# Patient Record
Sex: Male | Born: 1970 | Race: White | Hispanic: Yes | Marital: Married | Smoking: Never smoker
Health system: Southern US, Community
[De-identification: ages and names within clinical notes are randomized; demographics above are authoritative.]

## PROBLEM LIST (undated history)

## (undated) DIAGNOSIS — K2970 Gastritis, unspecified, without bleeding: Secondary | ICD-10-CM

## (undated) DIAGNOSIS — E785 Hyperlipidemia, unspecified: Secondary | ICD-10-CM

## (undated) DIAGNOSIS — K219 Gastro-esophageal reflux disease without esophagitis: Secondary | ICD-10-CM

## (undated) HISTORY — DX: Gastro-esophageal reflux disease without esophagitis: K21.9

## (undated) HISTORY — DX: Gastritis, unspecified, without bleeding: K29.70

## (undated) HISTORY — DX: Hyperlipidemia, unspecified: E78.5

---

## 2008-08-23 ENCOUNTER — Emergency Department (HOSPITAL_COMMUNITY): Admission: EM | Admit: 2008-08-23 | Discharge: 2008-08-23 | Payer: Self-pay | Admitting: Emergency Medicine

## 2009-01-18 ENCOUNTER — Ambulatory Visit: Payer: Self-pay | Admitting: Internal Medicine

## 2017-01-04 ENCOUNTER — Ambulatory Visit (INDEPENDENT_AMBULATORY_CARE_PROVIDER_SITE_OTHER): Payer: 59 | Admitting: Physician Assistant

## 2017-01-04 VITALS — BP 126/84 | HR 80 | Temp 97.6°F | Resp 16 | Ht 67.0 in | Wt 198.0 lb

## 2017-01-04 DIAGNOSIS — Z23 Encounter for immunization: Secondary | ICD-10-CM

## 2017-01-04 DIAGNOSIS — Z13228 Encounter for screening for other metabolic disorders: Secondary | ICD-10-CM | POA: Diagnosis not present

## 2017-01-04 DIAGNOSIS — L709 Acne, unspecified: Secondary | ICD-10-CM | POA: Diagnosis not present

## 2017-01-04 DIAGNOSIS — Z0000 Encounter for general adult medical examination without abnormal findings: Secondary | ICD-10-CM

## 2017-01-04 DIAGNOSIS — Z1329 Encounter for screening for other suspected endocrine disorder: Secondary | ICD-10-CM | POA: Diagnosis not present

## 2017-01-04 DIAGNOSIS — Z125 Encounter for screening for malignant neoplasm of prostate: Secondary | ICD-10-CM | POA: Diagnosis not present

## 2017-01-04 DIAGNOSIS — Z13220 Encounter for screening for lipoid disorders: Secondary | ICD-10-CM

## 2017-01-04 DIAGNOSIS — Z130 Encounter for screening for diseases of the blood and blood-forming organs and certain disorders involving the immune mechanism: Secondary | ICD-10-CM | POA: Diagnosis not present

## 2017-01-04 DIAGNOSIS — Z113 Encounter for screening for infections with a predominantly sexual mode of transmission: Secondary | ICD-10-CM

## 2017-01-04 MED ORDER — CLINDAMYCIN PHOSPHATE 1 % EX FOAM: 1.0000 "application " | Freq: Every day | CUTANEOUS | 0 refills | Status: DC

## 2017-01-04 NOTE — Progress Notes (Signed)
[facility] [address], [place] [address] [phone]  Date:  01/04/2017   Name:  Nathaniel Davidson   DOB:  [birth year]   MRN:  [id number]  PCP:  No primary care provider on file.    History of Present Illness:  Nathaniel Davidson is a 46 y.o. male patient who presents to [facility] for annuall physical exam. --1.5 years since his cholesterol was checked.   -- --DIET: chicken, fish, rice.  Vegetables daily.  Water intake: 2 L per day.  Caffeine intake: once per day.  Soda 1-2 per week.  Sweet tea little during mealtime.  --BM: normal.  No black or blood in stool.  No diarrhea or constipation  --URINATION: normal.  No dysuria, hematuria, or frequency.  --SLEEP: works, bed to early hard to sleep.  Later is easier  7 hours per night  --SOCIAL ACTIVITY: no hobbies, going to restaurants.  Friends in the area.   Industrial cleaning EtOH: 1-3 beers Tobacco or vaping: no Illicit drug use: No No difficulty with sexual activity or pain  Small concern of rash along the back of his head with bumps that rise and go away.  Wife will pull hairs out of the bumps and pus will come out.  He wears a helmet where there is constant rubbing.     There are no active problems to display for this patient.   History reviewed. No pertinent past medical history.  History reviewed. No pertinent surgical history.  Social History  Substance Use Topics  . Smoking status: Never Smoker  . Smokeless tobacco: Never Used  . Alcohol use 0.6 - 1.8 oz/week    1 - 3 Cans of beer per week    Family History  Problem Relation Age of Onset  . Cancer Father 19    prostate    No Known Allergies  Medication list has been reviewed and updated.  No current outpatient prescriptions on file prior to visit.   No current facility-administered medications on file prior to visit.     Review of Systems  Constitutional: Negative for chills and fever.  HENT: Negative for ear  discharge, ear pain and sore throat.   Eyes: Negative for blurred vision and double vision.  Respiratory: Negative for cough, shortness of breath and wheezing.   Cardiovascular: Negative for chest pain, palpitations and leg swelling.  Gastrointestinal: Negative for diarrhea, nausea and vomiting.  Genitourinary: Negative for dysuria, frequency and hematuria.  Skin: Negative for itching and rash.  Neurological: Negative for dizziness and headaches.     Physical Examination: BP 126/84 (BP Location: Right Arm, Patient Position: Sitting, Cuff Size: Small)   Pulse 80   Temp 97.6 F (36.4 C) (Oral)   Resp 16   Ht 5' 7"  (1.702 m)   Wt 198 lb (89.8 kg)   SpO2 100%   BMI 31.01 kg/m  Ideal Body Weight: Weight in (lb) to have BMI = 25: 159.3  Physical Exam  Constitutional: He is oriented to person, place, and time. He appears well-developed and well-nourished. No distress.  HENT:  Head: Normocephalic and atraumatic.  Right Ear: Tympanic membrane, external ear and ear canal normal.  Left Ear: Tympanic membrane, external ear and ear canal normal.  Nose: No mucosal edema or rhinorrhea. Right sinus exhibits no maxillary sinus tenderness and no frontal sinus tenderness. Left sinus exhibits no maxillary sinus tenderness and no frontal sinus tenderness.  Mouth/Throat: No uvula swelling. No oropharyngeal exudate, posterior oropharyngeal edema or posterior  oropharyngeal erythema.  Scant papules and pustules along the the occipital in a linear positioning.    Eyes: Conjunctivae, EOM and lids are normal. Pupils are equal, round, and reactive to light. Right eye exhibits normal extraocular motion. Left eye exhibits normal extraocular motion.  Neck: Trachea normal and full passive range of motion without pain. No edema and no erythema present.  Cardiovascular: Normal rate and regular rhythm.  Exam reveals no friction rub.   No murmur heard. Pulmonary/Chest: Effort normal. No respiratory distress. He has  no decreased breath sounds. He has no wheezes. He has no rhonchi.  Abdominal: Soft. Bowel sounds are normal. He exhibits no distension and no mass. There is no tenderness.  Musculoskeletal: Normal range of motion. He exhibits no edema or tenderness.  Neurological: He is alert and oriented to person, place, and time. He displays normal reflexes.  Skin: Skin is warm and dry. He is not diaphoretic.  Psychiatric: He has a normal mood and affect. His behavior is normal.     Assessment and Plan: Nathaniel Davidson is a 46 y.o. male who is here today for annual physical exam. Will restart simvastatin as needed pending lipid panel. Declines prostate exam at this time. Advised to disinfect work hat.  Foam daily for rash following shampooing hair..  Annual physical exam - Plan: Lipid panel, TSH, CMP14+EGFR, CBC, PSA, HIV antibody, RPR  Need for prophylactic vaccination and inoculation against influenza - Plan: Flu Vaccine QUAD 36+ mos PF IM (Fluarix & Fluzone Quad PF)  Screening for thyroid disorder - Plan: TSH  Screening for lipid disorders - Plan: Lipid panel  Screening for deficiency anemia - Plan: CBC  Screening for prostate cancer - Plan: PSA  Screening for STD (sexually transmitted disease) - Plan: HIV antibody, RPR  Screening for metabolic disorder - Plan: CMP14+EGFR  Acne, unspecified acne type - Plan: Clindamycin Phosphate foam  Ivar Drape, PA-C [facility] 3/11/20186:25 PM

## 2017-01-04 NOTE — Patient Instructions (Signed)
Recommend increase your exercise to 4 times per day for 30 minutes Make sure you are having water intake greater than 64 oz Keeping you healthy  Get these tests  Blood pressure- Have your blood pressure checked once a year by your healthcare provider.  Normal blood pressure is 120/80.  Weight- Have your body mass index (BMI) calculated to screen for obesity.  BMI is a measure of body fat based on height and weight. You can also calculate your own BMI at https://www.west-esparza.com/www.[email or web address]/bmi/.  Cholesterol- Have your cholesterol checked regularly starting at age 48, sooner may be necessary if you have diabetes, high blood pressure, if a family member developed heart diseases at an early age or if you smoke.   Chlamydia, HIV, and other sexual transmitted disease- Get screened each year until the age of 48 then within three months of each new sexual partner.  Diabetes- Have your blood sugar checked regularly if you have high blood pressure, high cholesterol, a family history of diabetes or if you are overweight.  Get these vaccines  Flu shot- Every fall.  Tetanus shot- Every 10 years.  Menactra- Single dose; prevents meningitis.  Take these steps  Don't smoke- If you do smoke, ask your healthcare provider about quitting. For tips on how to quit, go to www.smokefree.gov or call 1-800-QUIT-NOW.  Be physically active- Exercise 5 days a week for at least 30 minutes.  If you are not already physically active start slow and gradually work up to 30 minutes of moderate physical activity.  Examples of moderate activity include walking briskly, mowing the yard, dancing, swimming bicycling, etc.  Eat a healthy diet- Eat a variety of healthy foods such as fruits, vegetables, low fat milk, low fat cheese, yogurt, lean meats, poultry, fish, beans, tofu, etc.  For more information on healthy eating, go to www.thenutritionsource.org  Drink alcohol in moderation- Limit alcohol intake two drinks or less a day.  Never  drink and drive.  Dentist- Brush and floss teeth twice daily; visit your dentis twice a year.  Depression-Your emotional health is as important as your physical health.  If you're feeling down, losing interest in things you normally enjoy please talk with your healthcare provider.  Gun Safety- If you keep a gun in your home, keep it unloaded and with the safety lock on.  Bullets should be stored separately.  Helmet use- Always wear a helmet when riding a motorcycle, bicycle, rollerblading or skateboarding.  Safe sex- If you may be exposed to a sexually transmitted infection, use a condom  Seat belts- Seat bels can save your life; always wear one.  Smoke/Carbon Monoxide detectors- These detectors need to be installed on the appropriate level of your home.  Replace batteries at least once a year.  Skin Cancer- When out in the sun, cover up and use sunscreen SPF 15 or higher.  Violence- If anyone is threatening or hurting you, please tell your healthcare provider.

## 2017-01-06 LAB — CBC
Hematocrit: 50.4 % (ref 37.5–51.0)
Hemoglobin: 17.4 g/dL (ref 13.0–17.7)
MCH: 28.5 pg (ref 26.6–33.0)
MCHC: 34.5 g/dL (ref 31.5–35.7)
MCV: 83 fL (ref 79–97)
Platelets: 224 10*3/uL (ref 150–379)
RBC: 6.1 x10E6/uL — ABNORMAL HIGH (ref 4.14–5.80)
RDW: 14 % (ref 12.3–15.4)
WBC: 4.8 10*3/uL (ref 3.4–10.8)

## 2017-01-06 LAB — CMP14+EGFR
A/G RATIO: 1.6 (ref 1.2–2.2)
ALBUMIN: 4.3 g/dL (ref 3.5–5.5)
ALT: 23 IU/L (ref 0–44)
AST: 16 IU/L (ref 0–40)
Alkaline Phosphatase: 82 IU/L (ref 39–117)
BILIRUBIN TOTAL: 0.4 mg/dL (ref 0.0–1.2)
BUN / CREAT RATIO: 14 (ref 9–20)
BUN: 13 mg/dL (ref 6–24)
CHLORIDE: 101 mmol/L (ref 96–106)
CO2: 26 mmol/L (ref 18–29)
Calcium: 9 mg/dL (ref 8.7–10.2)
Creatinine, Ser: 0.95 mg/dL (ref 0.76–1.27)
GFR calc non Af Amer: 95 mL/min/{1.73_m2} (ref >59)
GFR, EST AFRICAN AMERICAN: 110 mL/min/{1.73_m2} (ref >59)
GLOBULIN, TOTAL: 2.7 g/dL (ref 1.5–4.5)
Glucose: 88 mg/dL (ref 65–99)
POTASSIUM: 4.8 mmol/L (ref 3.5–5.2)
Sodium: 139 mmol/L (ref 134–144)
TOTAL PROTEIN: 7 g/dL (ref 6.0–8.5)

## 2017-01-06 LAB — TSH: TSH: 1.19 u[IU]/mL (ref 0.450–4.500)

## 2017-01-06 LAB — HIV ANTIBODY (ROUTINE TESTING W REFLEX): HIV SCREEN 4TH GENERATION: NONREACTIVE

## 2017-01-06 LAB — LIPID PANEL
CHOLESTEROL TOTAL: 236 mg/dL — AB (ref 100–199)
Chol/HDL Ratio: 4.6 ratio units (ref 0.0–5.0)
HDL: 51 mg/dL (ref >39)
LDL Calculated: 149 mg/dL — ABNORMAL HIGH (ref 0–99)
Triglycerides: 180 mg/dL — ABNORMAL HIGH (ref 0–149)
VLDL CHOLESTEROL CAL: 36 mg/dL (ref 5–40)

## 2017-01-06 LAB — RPR: RPR Ser Ql: NONREACTIVE

## 2017-01-06 LAB — PSA: PROSTATE SPECIFIC AG, SERUM: 0.8 ng/mL (ref 0.0–4.0)

## 2017-01-20 ENCOUNTER — Telehealth: Payer: Self-pay

## 2017-01-20 NOTE — Telephone Encounter (Signed)
Pt stopped in for copy of labs, lipids abnormal, any advice?

## 2017-06-05 ENCOUNTER — Ambulatory Visit: Payer: 59 | Admitting: Emergency Medicine

## 2017-07-22 ENCOUNTER — Ambulatory Visit (INDEPENDENT_AMBULATORY_CARE_PROVIDER_SITE_OTHER): Payer: 59 | Admitting: Emergency Medicine

## 2017-07-22 ENCOUNTER — Encounter: Payer: Self-pay | Admitting: Emergency Medicine

## 2017-07-22 VITALS — BP 112/76 | HR 76 | Temp 97.6°F | Resp 16 | Ht 68.25 in | Wt 188.2 lb

## 2017-07-22 DIAGNOSIS — R1013 Epigastric pain: Secondary | ICD-10-CM | POA: Insufficient documentation

## 2017-07-22 DIAGNOSIS — L709 Acne, unspecified: Secondary | ICD-10-CM

## 2017-07-22 MED ORDER — CLINDAMYCIN PHOSPHATE 1 % EX FOAM: 1.0000 "application " | Freq: Every day | CUTANEOUS | 0 refills | Status: DC

## 2017-07-22 MED ORDER — RANITIDINE HCL 300 MG PO TABS: 300.0000 mg | ORAL_TABLET | Freq: Every day | ORAL | 1 refills | Status: DC

## 2017-07-22 MED ORDER — OMEPRAZOLE 20 MG PO CPDR: 20.0000 mg | DELAYED_RELEASE_CAPSULE | Freq: Two times a day (BID) | ORAL | 1 refills | Status: DC

## 2017-07-22 NOTE — Patient Instructions (Addendum)
IF you received an x-ray today, you will receive an invoice from [facility]. Please contact [facility] at [phone] with questions or concerns regarding your invoice.   IF you received labwork today, you will receive an invoice from [place]. Please contact [facility] at [phone] with questions or concerns regarding your invoice.   Our billing staff will not be able to assist you with questions regarding bills from these companies.  You will be contacted with the lab results as soon as they are available. The fastest way to get your results is to activate your My Chart account. Instructions are located on the last page of this paperwork. If you have not heard from [place] regarding the results in 2 weeks, please contact this office.     Gastritis en los adultos (Gastritis, Adult) La gastritis es la irritacin (inflamacin) del estmago. Si tiene [employer], puede presentar algunos de estos problemas (sntomas):  Dolor estomacal.  Sensacin de ardor en el estmago.  Ganas de vomitar (nuseas).  Vmitos.  Sensacin de estar demasiado lleno despus de comer. Es importante recibir ayuda para Copy. Sin ayuda, el [employer] y se pueden formar llagas (lceras). CUIDADOS EN EL HOGAR  Tome los medicamentos de venta libre y los recetados solamente como se lo haya indicado el mdico.  Si le recetaron un antibitico, tmelo como se lo haya indicado el mdico. No deje de tomarlo aunque comience a sentirse mejor.  Beba suficiente lquido para mantener el pis (orina) claro o de color amarillo plido.  En lugar de comer en gran cantidad, prepare pequeas porciones de comida. SOLICITE AYUDA SI:  Los problemas empeoran.  Los problemas desaparecen, pero luego vuelven a Research officer, trade union. SOLICITE AYUDA DE INMEDIATO SI:  Vomita sangre o una sustancia parecida a los granos de [place].  La materia fecal (heces) es negra o de color rojo oscuro.  No puede  retener los lquidos.  El dolor de [place].  Tiene fiebre.  No mejora luego de 1 semana. Esta informacin no tiene Theme park manager el consejo del mdico. Asegrese de hacerle al mdico cualquier pregunta que tenga. Document Released: 04/14/2012 Document Revised: 07/08/2015 Document Reviewed: 07/08/2015 Elsevier Interactive Patient Education  2018 Elsevier Inc.  Gastritis, Adult Gastritis is swelling (inflammation) of the stomach. When you have this condition, you can have these problems (symptoms):  Pain in your stomach.  A burning feeling in your stomach.  Feeling sick to your stomach (nauseous).  Throwing up (vomiting).  Feeling too full after you eat.  It is important to get help for this condition. Without help, your stomach can bleed, and you can get sores (ulcers) in your stomach. Follow these instructions at home:  Take over-the-counter and prescription medicines only as told by your doctor.  If you were prescribed an antibiotic medicine, take it as told by your doctor. Do not stop taking it even if you start to feel better.  Drink enough fluid to keep your pee (urine) clear or pale yellow.  Instead of eating big meals, eat small meals often. Contact a health care provider if:  Your problems get worse.  Your problems go away and then come back. Get help right away if:  You throw up blood or something that looks like coffee grounds.  You have black or dark red poop (stools).  You cannot keep fluids down.  Your stomach pain gets worse.  You have a fever.  You do not feel better after 1 week. This information  is not intended to replace advice given to you by your health care provider. Make sure you discuss any questions you have with your health care provider. Document Released: 04/01/2008 Document Revised: 06/12/2016 Document Reviewed: 07/08/2015 Elsevier Interactive Patient Education  [employer]2018 Elsevier Inc.

## 2017-07-22 NOTE — Progress Notes (Signed)
Nathaniel Davidson 46 y.o.   Chief Complaint  Patient presents with  . Abdominal Pain    x 8-10 weeks    HISTORY OF PRESENT ILLNESS: This is a 46 y.o. male complaining of epigastric pain x 8 weeks.  Abdominal Pain  This is a new problem. The current episode started more than 1 month ago. The onset quality is gradual. The problem occurs intermittently. The problem has been gradually worsening. The pain is located in the epigastric region. The pain is at a severity of 5/10. The pain is moderate. The quality of the pain is burning and a sensation of fullness. The abdominal pain does not radiate. Associated symptoms include weight loss (10 lbs.). Pertinent negatives include no anorexia, arthralgias, belching, constipation, diarrhea, dysuria, fever, flatus, frequency, headaches, hematochezia, hematuria, melena, myalgias, nausea or vomiting. Associated symptoms comments: Early satiety.. Nothing aggravates the pain. The pain is relieved by nothing. He has tried antacids and proton pump inhibitors (Omeprazole) for the symptoms. The treatment provided mild relief. Prior diagnostic workup includes upper endoscopy (in [place] 10 years ago). His past medical history is significant for GERD. There is no history of abdominal surgery, colon cancer, Crohn's disease, gallstones, pancreatitis, PUD or ulcerative colitis.     Prior to Admission medications   Medication Sig Start Date End Date Taking? Authorizing Provider  Clindamycin Phosphate foam Apply 1 application topically daily. Patient not taking: Reported on 07/22/2017 01/04/17   Trena Platt D, PA    No Known Allergies  There are no active problems to display for this patient.   No past medical history on file.  No past surgical history on file.  Social History   Social History  . Marital status: Married    Spouse name: N/A  . Number of children: N/A  . Years of education: N/A   Occupational History  . Not on file.   Social  History Main Topics  . Smoking status: Never Smoker  . Smokeless tobacco: Never Used  . Alcohol use 0.6 - 1.8 oz/week    1 - 3 Cans of beer per week  . Drug use: No  . Sexual activity: Yes   Other Topics Concern  . Not on file   Social History Narrative  . No narrative on file    Family History  Problem Relation Age of Onset  . Cancer Father 64       prostate     Review of Systems  Constitutional: Positive for weight loss (10 lbs.). Negative for fever and malaise/fatigue.  HENT: Negative.  Negative for ear pain, nosebleeds and sore throat.   Eyes: Negative.  Negative for discharge and redness.  Respiratory: Negative.  Negative for cough, hemoptysis and shortness of breath.   Cardiovascular: Negative.  Negative for chest pain, palpitations, claudication and leg swelling.  Gastrointestinal: Positive for abdominal pain. Negative for anorexia, blood in stool, constipation, diarrhea, flatus, hematochezia, melena, nausea and vomiting.  Genitourinary: Negative for dysuria, frequency and hematuria.  Musculoskeletal: Negative.  Negative for arthralgias, joint pain, myalgias and neck pain.  Skin: Negative for rash.  Neurological: Negative for dizziness, sensory change, focal weakness, weakness and headaches.  Endo/Heme/Allergies: Negative.   All other systems reviewed and are negative.  Vitals:   07/22/17 1557  BP: 112/76  Pulse: 76  Resp: 16  Temp: 97.6 F (36.4 C)  SpO2: 99%     Physical Exam  Constitutional: He is oriented to person, place, and time. He appears well-developed and well-nourished.  HENT:  Head:  Normocephalic and atraumatic.  Right Ear: External ear normal.  Left Ear: External ear normal.  Nose: Nose normal.  Mouth/Throat: Oropharynx is clear and moist.  Eyes: Pupils are equal, round, and reactive to light. Conjunctivae and EOM are normal.  Neck: Normal range of motion. Neck supple.  Cardiovascular: Normal rate, regular rhythm, normal heart sounds and  intact distal pulses.   Pulmonary/Chest: Effort normal and breath sounds normal.  Abdominal: Soft. He exhibits no distension and no mass. There is tenderness (+epigastric tenderness). There is no rebound and no guarding.  Musculoskeletal: Normal range of motion.  Neurological: He is alert and oriented to person, place, and time. No sensory deficit. He exhibits normal muscle tone.  Skin: Skin is warm and dry. Capillary refill takes less than 2 seconds. No rash noted.  Psychiatric: He has a normal mood and affect. His behavior is normal.  Vitals reviewed.    ASSESSMENT & PLAN: Nathaniel Davidson was seen today for abdominal pain.  Diagnoses and all orders for this visit:  Acute epigastric pain -     CBC with Differential/Platelet -     Comprehensive metabolic panel -     Lipase -     H. pylori breath test -     Ambulatory referral to Gastroenterology  Acne, unspecified acne type -     Clindamycin Phosphate foam; Apply 1 application topically daily.  Other orders -     omeprazole (PRILOSEC) 20 MG capsule; Take 1 capsule (20 mg total) by mouth 2 (two) times daily before a meal. -     ranitidine (ZANTAC) 300 MG tablet; Take 1 tablet (300 mg total) by mouth at bedtime.    Patient Instructions       IF you received an x-ray today, you will receive an invoice from [facility] Rad[facility]. Please contact [facility] Radiology at [phone] with questions or concerns regarding your invoice.   IF you received labwork today, you will receive an invoice from [place]. Please contact [facility] at [phone] with questions or concerns regarding your invoice.   Our billing staff will not be able to assist you with questions regarding bills from these companies.  You will be contacted with the lab results as soon as they are available. The fastest way to get your results is to activate your My Chart account. Instructions are located on the last page of this paperwork. If you have not heard from [place]  regarding the results in 2 weeks, please contact this office.     Gastritis en los adultos (Gastritis, Adult) La gastritis es la irritacin (inflamacin) del estmago. Si tiene [employer], puede presentar algunos de estos problemas (sntomas):  Dolor estomacal.  Sensacin de ardor en el estmago.  Ganas de vomitar (nuseas).  Vmitos.  Sensacin de estar demasiado lleno despus de comer. Es importante recibir ayuda para Copy. Sin ayuda, el [employer] y se pueden formar llagas (lceras). CUIDADOS EN EL HOGAR  Tome los medicamentos de venta libre y los recetados solamente como se lo haya indicado el mdico.  Si le recetaron un antibitico, tmelo como se lo haya indicado el mdico. No deje de tomarlo aunque comience a sentirse mejor.  Beba suficiente lquido para mantener el pis (orina) claro o de color amarillo plido.  En lugar de comer en gran cantidad, prepare pequeas porciones de comida. SOLICITE AYUDA SI:  Los problemas empeoran.  Los problemas desaparecen, pero luego vuelven a Research officer, trade union. SOLICITE AYUDA DE INMEDIATO SI:  Vomita sangre o una sustancia parecida a  los granos de caf.  La materia fecal (heces) es negra o de color rojo oscuro.  No puede retener los lquidos.  El dolor de [place].  Tiene fiebre.  No mejora luego de 1 semana. Esta informacin no tiene Theme park manager el consejo del mdico. Asegrese de hacerle al mdico cualquier pregunta que tenga. Document Released: 04/14/2012 Document Revised: 07/08/2015 Document Reviewed: 07/08/2015 Elsevier Interactive Patient Education  2018 Elsevier Inc.  Gastritis, Adult Gastritis is swelling (inflammation) of the stomach. When you have this condition, you can have these problems (symptoms):  Pain in your stomach.  A burning feeling in your stomach.  Feeling sick to your stomach (nauseous).  Throwing up (vomiting).  Feeling too full after you eat.  It is  important to get help for this condition. Without help, your stomach can bleed, and you can get sores (ulcers) in your stomach. Follow these instructions at home:  Take over-the-counter and prescription medicines only as told by your doctor.  If you were prescribed an antibiotic medicine, take it as told by your doctor. Do not stop taking it even if you start to feel better.  Drink enough fluid to keep your pee (urine) clear or pale yellow.  Instead of eating big meals, eat small meals often. Contact a health care provider if:  Your problems get worse.  Your problems go away and then come back. Get help right away if:  You throw up blood or something that looks like coffee grounds.  You have black or dark red poop (stools).  You cannot keep fluids down.  Your stomach pain gets worse.  You have a fever.  You do not feel better after 1 week. This information is not intended to replace advice given to you by your health care provider. Make sure you discuss any questions you have with your health care provider. Document Released: 04/01/2008 Document Revised: 06/12/2016 Document Reviewed: 07/08/2015 Elsevier Interactive Patient Education  2018 Elsevier Inc.      Edwina Barth, MD [facility]

## 2017-07-23 LAB — COMPREHENSIVE METABOLIC PANEL
A/G RATIO: 1.8 (ref 1.2–2.2)
ALT: 23 IU/L (ref 0–44)
AST: 20 IU/L (ref 0–40)
Albumin: 4.7 g/dL (ref 3.5–5.5)
Alkaline Phosphatase: 83 IU/L (ref 39–117)
BUN/Creatinine Ratio: 14 (ref 9–20)
BUN: 14 mg/dL (ref 6–24)
Bilirubin Total: 0.3 mg/dL (ref 0.0–1.2)
CALCIUM: 9.5 mg/dL (ref 8.7–10.2)
CO2: 24 mmol/L (ref 20–29)
CREATININE: 1.02 mg/dL (ref 0.76–1.27)
Chloride: 100 mmol/L (ref 96–106)
GFR calc Af Amer: 100 mL/min/{1.73_m2} (ref >59)
GFR, EST NON AFRICAN AMERICAN: 87 mL/min/{1.73_m2} (ref >59)
Globulin, Total: 2.6 g/dL (ref 1.5–4.5)
Glucose: 84 mg/dL (ref 65–99)
POTASSIUM: 4.6 mmol/L (ref 3.5–5.2)
Sodium: 140 mmol/L (ref 134–144)
Total Protein: 7.3 g/dL (ref 6.0–8.5)

## 2017-07-23 LAB — CBC WITH DIFFERENTIAL/PLATELET
BASOS ABS: 0 10*3/uL (ref 0.0–0.2)
Basos: 1 %
EOS (ABSOLUTE): 0.1 10*3/uL (ref 0.0–0.4)
Eos: 2 %
Hematocrit: 47.3 % (ref 37.5–51.0)
Hemoglobin: 16.3 g/dL (ref 13.0–17.7)
IMMATURE GRANULOCYTES: 0 %
Immature Grans (Abs): 0 10*3/uL (ref 0.0–0.1)
Lymphocytes Absolute: 1.5 10*3/uL (ref 0.7–3.1)
Lymphs: 28 %
MCH: 29.2 pg (ref 26.6–33.0)
MCHC: 34.5 g/dL (ref 31.5–35.7)
MCV: 85 fL (ref 79–97)
MONOS ABS: 0.3 10*3/uL (ref 0.1–0.9)
Monocytes: 6 %
NEUTROS PCT: 63 %
Neutrophils Absolute: 3.5 10*3/uL (ref 1.4–7.0)
PLATELETS: 245 10*3/uL (ref 150–379)
RBC: 5.58 x10E6/uL (ref 4.14–5.80)
RDW: 13.6 % (ref 12.3–15.4)
WBC: 5.4 10*3/uL (ref 3.4–10.8)

## 2017-07-23 LAB — LIPASE: Lipase: 18 U/L (ref 13–78)

## 2017-07-24 LAB — H. PYLORI BREATH TEST: H PYLORI BREATH TEST: POSITIVE — AB

## 2017-07-25 ENCOUNTER — Other Ambulatory Visit: Payer: Self-pay | Admitting: Emergency Medicine

## 2017-07-25 ENCOUNTER — Telehealth: Payer: Self-pay | Admitting: Emergency Medicine

## 2017-07-25 MED ORDER — AMOXICILLIN 500 MG PO CAPS: 1000.0000 mg | ORAL_CAPSULE | Freq: Two times a day (BID) | ORAL | 0 refills | Status: AC

## 2017-07-25 MED ORDER — CLARITHROMYCIN 500 MG PO TABS: 500.0000 mg | ORAL_TABLET | Freq: Two times a day (BID) | ORAL | 0 refills | Status: AC

## 2017-07-25 NOTE — Telephone Encounter (Signed)
Tried calling patient about Hpylori test and need to start antibiotic; left message.

## 2017-07-30 ENCOUNTER — Encounter: Payer: Self-pay | Admitting: Physician Assistant

## 2017-08-15 ENCOUNTER — Encounter: Payer: Self-pay | Admitting: Gastroenterology

## 2017-08-15 ENCOUNTER — Ambulatory Visit (INDEPENDENT_AMBULATORY_CARE_PROVIDER_SITE_OTHER): Payer: 59 | Admitting: Physician Assistant

## 2017-08-15 ENCOUNTER — Encounter: Payer: Self-pay | Admitting: Physician Assistant

## 2017-08-15 VITALS — BP 124/80 | HR 98 | Ht 67.0 in | Wt 196.0 lb

## 2017-08-15 DIAGNOSIS — R1013 Epigastric pain: Secondary | ICD-10-CM

## 2017-08-15 DIAGNOSIS — Z8619 Personal history of other infectious and parasitic diseases: Secondary | ICD-10-CM | POA: Diagnosis not present

## 2017-08-15 DIAGNOSIS — G8929 Other chronic pain: Secondary | ICD-10-CM

## 2017-08-15 MED ORDER — PANTOPRAZOLE SODIUM 40 MG PO TBEC: DELAYED_RELEASE_TABLET | ORAL | 6 refills | Status: DC

## 2017-08-15 NOTE — Progress Notes (Signed)
Reviewed and agree with management plan.  Mikaylee Arseneau T. Ac Colan, MD FACG 

## 2017-08-15 NOTE — Progress Notes (Signed)
Subjective:    Patient ID: Nathaniel Davidson, male    DOB: 1971-07-27, 46 y.o.   MRN: [id number]  HPI  Nathaniel Davidson is a pleasant 46 year old [place] male, new to GI today referred by Dr. Loura HaltMiguel Davidson/[facility] for evaluation of abdominal pain. Patient is non-English speaking and accompanied by an interpreter today. He relates history of epigastric pain over the past 3-4 months which is been persistent. He describes it as a pressure and burning type pain which is sometimes better for a while after he eats. His appetite has been okay, he did lose a little bit of weight of onset of symptoms but has regained that. He has not had any nausea or vomiting, no fever, no changes in bowel habits melena or hematochezia. He relates history of acid reflux and did have an endoscopy about 10 years ago in [place] which she says was negative. He has not been on any chronic medication. He denies any dysphagia or odynophagia. Denies daily EtOH, and no regular NSAIDs. He is a nonsmoker. Patient had initially been given Prilosec 20 mg by mouth daily and ranitidine 300 mg every evening. He did not see much benefit with these medications. Recent labs including CBC, CMET , and lipase all within normal limits. He had H. pylori breath testing done in September 2018 and this was positive. He took a two-week course of amoxicillin and clarithromycin. He says he felt a little bit better while on antibiotics but then the pain came back.   Review of Systems Pertinent positive and negative review of systems were noted in the above HPI section.  All other review of systems was otherwise negative.  Outpatient Encounter Prescriptions as of 08/15/2017  Medication Sig  . Clindamycin Phosphate foam Apply 1 application topically daily. (Patient not taking: Reported on 08/15/2017)  . omeprazole (PRILOSEC) 20 MG capsule Take 1 capsule (20 mg total) by mouth 2 (two) times daily before a meal.  . pantoprazole (PROTONIX) 40 MG  tablet Take 1 tablet  before breakfast daily.  . ranitidine (ZANTAC) 300 MG tablet Take 1 tablet (300 mg total) by mouth at bedtime.   No facility-administered encounter medications on file as of 08/15/2017.    No Known Allergies Patient Active Problem List   Diagnosis Date Noted  . Acute epigastric pain 07/22/2017   Social History   Social History  . Marital status: Married    Spouse name: N/A  . Number of children: N/A  . Years of education: N/A   Occupational History  . Not on file.   Social History Main Topics  . Smoking status: Never Smoker  . Smokeless tobacco: Never Used  . Alcohol use 0.6 - 1.8 oz/week    1 - 3 Cans of beer per week  . Drug use: No  . Sexual activity: Yes   Other Topics Concern  . Not on file   Social History Narrative  . No narrative on file    Nathaniel Davidson's family history includes Cancer (age of onset: 3565) in his father.      Objective:    Vitals:   08/15/17 0918  BP: 124/80  Pulse: 98  SpO2: 98%    Physical Exam well-developed Hispanic male in no acute distress, accompanied by interpreter. Blood pressure 124/80, pulse 98, height 5 foot 7, weight 196, BMI 30.7. HEENT; nontraumatic normocephalic EOMI PERRLA sclera anicteric, Cardiovascular; regular rate and rhythm with S1-S2 no murmur or gallop, Pulmonary; clear bilaterally, Abdomen;soft, bowel sounds are present he is tender  in the epigastrium is no palpable mass or hepatosplenomegaly no guarding or rebound, Rectal; exam not done, Extremities; no clubbing cyanosis or edema skin warm and dry, Neuropsych; mood and affect appropriate       Assessment & Plan:   #53 46 year old Hispanic male, non-English speaking referred for 3-4 month history of persistent epigastric pain. Baseline labs unremarkable, H. pylori breath testing positive and completed a course of amoxicillin and clarithromycin with some improvement in symptoms only while on medication. No initial response to  PPI. Etiology of his epigastric pain is not clear, rule out peptic ulcer disease, chronic gastropathy resistant H. Pylori.   Plan; start Protonix 40 mg by mouth every morning before meals breakfast Schedule for upper endoscopy with Dr. Russella Davidson. Procedure discussed in detail with patient including risks and benefits and he is agreeable to proceed. Patient will need repeat breath testing or H. pylori stool antigen, will hold for now pending EGD results. He would need to be off PPI for 2 weeks prior to repeat testing. If EGD is unremarkable would proceed with further imaging with CT abdomen .  Nathaniel S Esterwood PA-C 08/15/2017   Cc: Nathaniel Davidson, *

## 2017-08-15 NOTE — Patient Instructions (Signed)
We have sent the following medications to your pharmacy for you to pick up at your convenience: [facility] [address]  1. Pantoprazole sodium 40 mg  You have been scheduled for an endoscopy. Please follow written instructions given to you at your visit today. If you use inhalers (even only as needed), please bring them with you on the day of your procedure. Your physician has requested that you go to www.startemmi.com and enter the access code given to you at your visit today. This web site gives a general overview about your procedure. However, you should still follow specific instructions given to you by our office regarding your preparation for the procedure.  We have provided you with a work note.

## 2017-08-28 ENCOUNTER — Encounter: Payer: Self-pay | Admitting: Gastroenterology

## 2017-08-28 ENCOUNTER — Ambulatory Visit (AMBULATORY_SURGERY_CENTER): Payer: 59 | Admitting: Gastroenterology

## 2017-08-28 VITALS — BP 102/67 | HR 86 | Temp 97.8°F | Resp 10 | Ht 67.0 in | Wt 196.0 lb

## 2017-08-28 DIAGNOSIS — R1013 Epigastric pain: Secondary | ICD-10-CM

## 2017-08-28 DIAGNOSIS — K2950 Unspecified chronic gastritis without bleeding: Secondary | ICD-10-CM | POA: Diagnosis not present

## 2017-08-28 DIAGNOSIS — K319 Disease of stomach and duodenum, unspecified: Secondary | ICD-10-CM | POA: Diagnosis not present

## 2017-08-28 MED ORDER — SODIUM CHLORIDE 0.9 % IV SOLN: 500.0000 mL | INTRAVENOUS | Status: DC

## 2017-08-28 NOTE — Progress Notes (Signed)
Interpreter used today at the [facility] for this pt.  Interpreter's name is-Reynaldo Trisha MangleDiaz.

## 2017-08-28 NOTE — Patient Instructions (Signed)
Impression/Recommendations:  Gastritis handout given to patient.  Resume previous diet. Continue present medications.  Await pathology results.  Perform CT scan of abdomen with contrast and pelvis with contrast at next available appointment.  YOU HAD AN ENDOSCOPIC PROCEDURE TODAY AT THE [facility]:   Refer to the procedure report that was given to you for any specific questions about what was found during the examination.  If the procedure report does not answer your questions, please call your gastroenterologist to clarify.  If you requested that your care partner not be given the details of your procedure findings, then the procedure report has been included in a sealed envelope for you to review at your convenience later.  YOU SHOULD EXPECT: Some feelings of bloating in the abdomen. Passage of more gas than usual.  Walking can help get rid of the air that was put into your GI tract during the procedure and reduce the bloating. If you had a lower endoscopy (such as a colonoscopy or flexible sigmoidoscopy) you may notice spotting of blood in your stool or on the toilet paper. If you underwent a bowel prep for your procedure, you may not have a normal bowel movement for a few days.  Please Note:  You might notice some irritation and congestion in your nose or some drainage.  This is from the oxygen used during your procedure.  There is no need for concern and it should clear up in a day or so.  SYMPTOMS TO REPORT IMMEDIATELY:   Following upper endoscopy (EGD)  Vomiting of blood or coffee ground material  New chest pain or pain under the shoulder blades  Painful or persistently difficult swallowing  New shortness of breath  Fever of 100F or higher  Black, tarry-looking stools  For urgent or emergent issues, a gastroenterologist can be reached at any hour by calling [phone].   DIET:  We do recommend a small meal at first, but then you may proceed to your regular  diet.  Drink plenty of fluids but you should avoid alcoholic beverages for 24 hours.  ACTIVITY:  You should plan to take it easy for the rest of today and you should NOT DRIVE or use heavy machinery until tomorrow (because of the sedation medicines used during the test).    FOLLOW UP: Our staff will call the number listed on your records the next business day following your procedure to check on you and address any questions or concerns that you may have regarding the information given to you following your procedure. If we do not reach you, we will leave a message.  However, if you are feeling well and you are not experiencing any problems, there is no need to return our call.  We will assume that you have returned to your regular daily activities without incident.  If any biopsies were taken you will be contacted by phone or by letter within the next 1-3 weeks.  Please call us at [phone] if you have not heard about the biopsies in 3 weeks.    SIGNATURES/CONFIDENTIALITY: You and/or your care partner have signed paperwork which will be entered into your electronic medical record.  These signatures attest to the fact that that the information above on your After Visit Summary has been reviewed and is understood.  Full responsibility of the confidentiality of this discharge information lies with you and/or your care-partner.

## 2017-08-28 NOTE — Progress Notes (Signed)
Called to room to assist during endoscopic procedure.  Patient ID and intended procedure confirmed with present staff. Received instructions for my participation in the procedure from the performing physician.  

## 2017-08-28 NOTE — Op Note (Signed)
[facility] Patient Name: Nathaniel Davidson Procedure Date: 08/28/2017 10:18 AM MRN: [id number] Endoscopist: Meryl Dare , MD Age: 46 Referring MD:  Date of Birth: 12-24-1970 Gender: Male Account #: [id number] Procedure:                Upper GI endoscopy Indications:              Epigastric abdominal pain Medicines:                Monitored Anesthesia Care Procedure:                Pre-Anesthesia Assessment:                           - Prior to the procedure, a History and Physical                            was performed, and patient medications and                            allergies were reviewed. The patient's tolerance of                            previous anesthesia was also reviewed. The risks                            and benefits of the procedure and the sedation                            options and risks were discussed with the patient.                            All questions were answered, and informed consent                            was obtained. Prior Anticoagulants: The patient has                            taken no previous anticoagulant or antiplatelet                            agents. ASA Grade Assessment: II - A patient with                            mild systemic disease. After reviewing the risks                            and benefits, the patient was deemed in                            satisfactory condition to undergo the procedure.                           After obtaining informed consent, the endoscope was  passed under direct vision. Throughout the                            procedure, the patient's blood pressure, pulse, and                            oxygen saturations were monitored continuously. The                            Endoscope was introduced through the mouth, and                            advanced to the second part of duodenum. The upper                            GI endoscopy was  accomplished without difficulty.                            The patient tolerated the procedure well. Scope In: Scope Out: Findings:                 The examined esophagus was normal.                           Patchy mild inflammation characterized by erythema                            and granularity was found in the gastric fundus and                            in the gastric body. Biopsies were taken with a                            cold forceps for histology.                           The duodenal bulb and second portion of the                            duodenum were normal.                           The exam of the stomach was otherwise normal. Complications:            No immediate complications. Estimated Blood Loss:     Estimated blood loss was minimal. Impression:               - Normal esophagus.                           - Gastritis. Biopsied.                           - Normal duodenal bulb and second portion of the  duodenum. Recommendation:           - Patient has a contact number available for                            emergencies. The signs and symptoms of potential                            delayed complications were discussed with the                            patient. Return to normal activities tomorrow.                            Written discharge instructions were provided to the                            patient.                           - Resume previous diet.                           - Continue present medications.                           - Await pathology results.                           - Perform a CT scan (computed tomography) of                            abdomen with contrast and pelvis with contrast at                            the next available appointment. Meryl Dare, MD 08/28/2017 10:44:32 AM This report has been signed electronically.

## 2017-08-28 NOTE — Progress Notes (Signed)
Pt's states no medical or surgical changes since previsit or office visit. 

## 2017-08-28 NOTE — Progress Notes (Signed)
Report given to PACU, vss 

## 2017-08-29 ENCOUNTER — Telehealth: Payer: Self-pay | Admitting: *Deleted

## 2017-08-29 NOTE — Telephone Encounter (Signed)
No answer. Left message to call if questions or concerns. 

## 2017-08-29 NOTE — Telephone Encounter (Signed)
  Follow up Call-  Call back number 08/28/2017  Post procedure Call Back phone  # [phone]  Permission to leave phone message Yes  Some recent data might be hidden     Patient questions:  Do you have a fever, pain , or abdominal swelling? No. Pain Score  0 *  Have you tolerated food without any problems? Yes.    Have you been able to return to your normal activities? Yes.    Do you have any questions about your discharge instructions: Diet   No. Medications  No. Follow up visit  No.  Do you have questions or concerns about your Care? No.  Actions: * If pain score is 4 or above: No action needed, pain <4.

## 2017-09-02 ENCOUNTER — Other Ambulatory Visit: Payer: Self-pay

## 2017-09-02 ENCOUNTER — Telehealth: Payer: Self-pay

## 2017-09-02 DIAGNOSIS — R1013 Epigastric pain: Secondary | ICD-10-CM

## 2017-09-02 NOTE — Telephone Encounter (Signed)
Pt scheduled for CT scan of abdomen and pelvis at [facility] located at [address], [address]. Please arrive there 09/09/17 at 2:45pm, the scan will be done at 3pm. Do not have any solid food after 11am that morning. You may continue to have clear liquids to drink. You will drink bottle 1 of contrast at 1pm and bottle 2 at 2pm. Please contact the office with any questions. Pts son aware of instructions.

## 2017-09-05 ENCOUNTER — Encounter: Payer: Self-pay | Admitting: Gastroenterology

## 2017-09-09 ENCOUNTER — Ambulatory Visit (INDEPENDENT_AMBULATORY_CARE_PROVIDER_SITE_OTHER)
Admission: RE | Admit: 2017-09-09 | Discharge: 2017-09-09 | Disposition: A | Discharge status: Home / Self Care | Payer: 59 | Source: Ambulatory Visit | Attending: Gastroenterology | Admitting: Gastroenterology

## 2017-09-09 DIAGNOSIS — R1013 Epigastric pain: Secondary | ICD-10-CM | POA: Diagnosis not present

## 2017-09-09 MED ORDER — IOPAMIDOL (ISOVUE-300) INJECTION 61%
100.0000 mL | Freq: Once | INTRAVENOUS | Status: AC | PRN
  Administered 2017-09-09: 100 mL via INTRAVENOUS

## 2017-09-10 ENCOUNTER — Other Ambulatory Visit: Payer: Self-pay

## 2017-09-10 ENCOUNTER — Telehealth: Payer: Self-pay | Admitting: Gastroenterology

## 2017-09-10 ENCOUNTER — Other Ambulatory Visit: Payer: Self-pay | Admitting: Hematology and Oncology

## 2017-09-10 MED ORDER — PANTOPRAZOLE SODIUM 40 MG PO TBEC: DELAYED_RELEASE_TABLET | ORAL | 6 refills | Status: DC

## 2017-09-10 NOTE — Telephone Encounter (Signed)
1 year of refills 

## 2017-09-10 NOTE — Telephone Encounter (Signed)
Patient states there are not any refills on the Protonix. I have taken care of that. How long do you want him to take the PPI?  Thanks

## 2018-01-19 ENCOUNTER — Encounter: Payer: Self-pay | Admitting: Family Medicine

## 2018-01-19 ENCOUNTER — Telehealth: Payer: Self-pay | Admitting: Family Medicine

## 2018-01-19 ENCOUNTER — Other Ambulatory Visit: Payer: Self-pay

## 2018-01-19 ENCOUNTER — Ambulatory Visit (INDEPENDENT_AMBULATORY_CARE_PROVIDER_SITE_OTHER): Payer: 59 | Admitting: Family Medicine

## 2018-01-19 VITALS — BP 122/76 | HR 90 | Temp 98.7°F | Ht 67.0 in | Wt 194.8 lb

## 2018-01-19 DIAGNOSIS — R1031 Right lower quadrant pain: Secondary | ICD-10-CM

## 2018-01-19 LAB — POCT URINALYSIS DIP (MANUAL ENTRY)
Bilirubin, UA: NEGATIVE
Blood, UA: NEGATIVE
Glucose, UA: NEGATIVE mg/dL
Ketones, POC UA: NEGATIVE mg/dL
Leukocytes, UA: NEGATIVE
Nitrite, UA: NEGATIVE
Protein Ur, POC: NEGATIVE mg/dL
Spec Grav, UA: 1.01 (ref 1.010–1.025)
Urobilinogen, UA: 0.2 E.U./dL
pH, UA: 6 (ref 5.0–8.0)

## 2018-01-19 NOTE — Patient Instructions (Addendum)
Hernia inguinal en los adultos (Inguinal Hernia, Adult) Una hernia inguinal se produce cuando la grasa o los intestinos empujan a travs de la zona donde las piernas se unen al abdomen inferior (ingle) y forman una protuberancia redondeada (bulto). Esta afeccin se desarrolla con el transcurso del tiempo. Existen tres tipos de hernias inguinales. Estos tipos son los siguientes:  Hernias que se pueden empujar hacia adentro del abdomen (son reducibles).  Hernias que no son reducibles (estn encarceladas).  Hernias que no son reducibles y pierden la irrigacin sangunea (estn estranguladas). Este tipo de hernia requiere ciruga de emergencia. CAUSAS Esta afeccin se produce cuando tiene una zona dbil en los msculos o en los tejidos. Esta zona dbil permite que la hernia la atraviese. Los factores desencadenantes de esta afeccin son los siguientes:  Un esfuerzo repentino de los msculos del abdomen inferior.  Levantar objetos pesados.  Dificultad para defecar. La dificultad para defecar (estreimiento) puede producir una hernia.  Tos. FACTORES DE RIESGO Es ms probable que esta afeccin se manifieste en:  Hombres.  Las embarazadas.  Personas que: ? Tienen sobrepeso. ? Realizan trabajos que requieren permanecer estar de pie durante largos perodos o levantar cargas pesadas. ? Haber tenido una hernia inguinal previamente. ? Fumar o tener una enfermedad en los pulmones. Estos factores pueden causar una tos duradera (crnica). SNTOMAS Los sntomas dependen del tamao de la hernia. Con frecuencia, una pequea hernia inguinal no tiene sntomas. Los sntomas de una hernia ms grande incluyen los siguientes:  Un bulto en la ingle. Este es fcil de ver cuando la persona se encuentra de pie. Podra no ser visible si la persona se encuentra acostada.  Dolor o ardor en la ingle. Esto ocurre especialmente al levantar cargas, realizar esfuerzos o toser.  Un dolor sordo o una sensacin de  presin en la ingle.  Un bulto en el escroto del hombre. Los sntomas de una hernia inguinal estrangulada pueden incluir lo siguiente:  Un bulto en la ingle que es muy doloroso y sensible al tacto.  Un bulto que se torna de color rojo o prpura.  Fiebre, nuseas y vmitos.  Imposibilidad de defecar o eliminar gases. DIAGNSTICO Esta afeccin se diagnostica mediante la historia clnica y un examen fsico. El mdico puede examinar la zona de la ingle y pedirle que tosa. TRATAMIENTO El tratamiento de esta afeccin depende del tamao de la hernia y si tiene sntomas. Si no tiene sntomas, el mdico podr indicarle que controle cuidadosamente la hernia y que concurra a todas las visitas de control. Si la hernia es ms grande o si tiene sntomas, el tratamiento puede incluir la ciruga. INSTRUCCIONES PARA EL CUIDADO EN EL HOGAR Estilo de vida  Beba suficiente lquido para mantener la orina clara o de color amarillo plido.  Lleve una dieta con alto contenido de fibra. Coma gran cantidad de frutas, verduras y cereales integrales. Hable con su mdico si tiene dudas.  Evite levantar objetos pesados.  Intente no estar de pie durante mucho tiempo.  No consuma productos que contengan tabaco, incluidos cigarrillos, tabaco de mascar o cigarrillos electrnicos. Si necesita ayuda para dejar de fumar, consulte al mdico.  Mantenga un peso saludable. Instrucciones generales  No trate de volver a introducir la hernia a la fuerza.  Observe si la hernia cambia de color o de tamao. Informe a su mdico si ocurre algn cambio.  Tome los medicamentos de venta libre y los recetados solamente como se lo haya indicado el mdico.  Concurra a todas las visitas   de control como se lo haya indicado el mdico. Esto es importante. SOLICITE ATENCIN MDICA SI:  Lance Mussiene fiebre.  Aparecen nuevos sntomas.  Los sntomas empeoran. SOLICITE ATENCIN MDICA DE INMEDIATO SI:  Tiene dolor en la ingle que comienza  repentinamente y [place].  Un bulto en la ingle que crece rpidamente y no desaparece.  Si es un hombre y tiene Herbalistun dolor repentino en el escroto o el tamao de este cambia de repente.  Un bulto en la zona de la ingle que se vuelve rojo o prpura y causa dolor al tacto.  Tiene nuseas o vmitos que no desaparecen.  Siente que el corazn late mucho ms rpido que lo normal.  Tiene dificultad para defecar o eliminar gases. Esta informacin no tiene Theme park managercomo fin reemplazar el consejo del mdico. Asegrese de hacerle al mdico cualquier pregunta que tenga. Document Released: 02/08/2013 Document Revised: 02/05/2016 Document Reviewed: 08/24/2014 Elsevier Interactive Patient Education  2018 [employer]     IF you received an x-ray today, you will receive an invoice from [facility]. Please contact [facility] at [phone] with questions or concerns regarding your invoice.   IF you received labwork today, you will receive an invoice from [facility]. Please contact [facility] at [phone] with questions or concerns regarding your invoice.   Our billing staff will not be able to assist you with questions regarding bills from these companies.  You will be contacted with the lab results as soon as they are available. The fastest way to get your results is to activate your My Chart account. Instructions are located on the last page of this paperwork. If you have not heard from us regarding the results in 2 weeks, please contact this office.

## 2018-01-19 NOTE — Telephone Encounter (Signed)
Called pt through translator services and told him to call [facility] to schedule his urgent appt for his u/s and gave him [place] [facility] phone number to schedule within a week. Pt understood and will do so.

## 2018-01-19 NOTE — Progress Notes (Signed)
3/25/201912:19 PM  Nathaniel OarRodolfo Davidson June 28, 1971, 47 y.o. male [id number]  Chief Complaint  Patient presents with  . Groin Pain    started feeling pain in the groin area on Friday. There was some heavy lifting that could be the cause of the pain, however not sure    HPI:   Patient is a 47 y.o. male with past medical history significant for GERD who presents today for 4 days of right inguinal/scrotal pain. He states that pain is constant, started in his right lower groin and has radiated down to his scrotum. Achy pain. He states 2 days prior to start of pain he was doing some heavy lifting. He denies any exacerbating factors. APAP has been helping. He denies any fever, chills, nausea, vomiting, abd pain, bulges, changes in bladder or bowel.    Depression screen [place] [facility] 2/9 01/19/2018 07/22/2017 01/04/2017  Decreased Interest 0 0 0  Down, Depressed, Hopeless 0 0 0  PHQ - 2 Score 0 0 0    No Known Allergies  Prior to Admission medications   Medication Sig Start Date End Date Taking? Authorizing Provider  pantoprazole (PROTONIX) 40 MG tablet Take 1 tablet  before breakfast daily. 09/10/17  Yes Meryl DareStark, Malcolm T, MD    History reviewed. No pertinent past medical history.  History reviewed. No pertinent surgical history.  Social History   Tobacco Use  . Smoking status: Never Smoker  . Smokeless tobacco: Never Used  Substance Use Topics  . Alcohol use: Yes    Alcohol/week: 0.6 - 1.8 oz    Types: 1 - 3 Cans of beer per week    Family History  Problem Relation Age of Onset  . Cancer Father [id number]       prostate    ROS Per hpi  OBJECTIVE:  Blood pressure 122/76, pulse 90, temperature 98.7 F (37.1 C), temperature source Oral, height 5\' 7"  (1.702 m), weight 194 lb 12.8 oz (88.4 kg), SpO2 99 %.  Physical Exam  Constitutional: He is oriented to person, place, and time and well-developed, well-nourished, and in no distress.  HENT:  Head: Normocephalic and atraumatic.    Mouth/Throat: Oropharynx is clear and moist.  Eyes: Pupils are equal, round, and reactive to light. EOM are normal.  Neck: Neck supple.  Cardiovascular: Normal rate and regular rhythm. Exam reveals no gallop and no friction rub.  No murmur heard. Pulmonary/Chest: Effort normal and breath sounds normal. He has no wheezes. He has no rales.  Abdominal: Soft. Bowel sounds are normal. There is no hepatosplenomegaly. There is no tenderness.  Right inguinal wall defect appreciated but no palpable hernia palpated with valsalva.  Genitourinary: Testes/scrotum normal and penis normal.  Neurological: He is alert and oriented to person, place, and time. Gait normal.  Skin: Skin is warm and dry.     Results for orders placed or performed in visit on 01/19/18 (from the past 24 hour(s))  POCT urinalysis dipstick     Status: None   Collection Time: 01/19/18 12:21 PM  Result Value Ref Range   Color, UA yellow yellow   Clarity, UA clear clear   Glucose, UA negative negative mg/dL   Bilirubin, UA negative negative   Ketones, POC UA negative negative mg/dL   Spec Grav, UA 9.5621.010 1.3081.010 - 1.025   Blood, UA negative negative   pH, UA 6.0 5.0 - 8.0   Protein Ur, POC negative negative mg/dL   Urobilinogen, UA 0.2 0.2 or 1.0 E.U./dL   Nitrite, UA Negative Negative  Leukocytes, UA Negative Negative    ASSESSMENT and PLAN  1. Right inguinal pain No palpable hernia on exam today. Ordering [place] to further evaluate area. Discussed ssx of concern. Information regarding hernia given.  - POCT urinalysis dipstick - US Scrotum; Future  Return if symptoms worsen or fail to improve.    Myles Lipps, MD Primary Care at [facility] [address] [place], [place] [address] Ph.  [phone] Fax [phone]

## 2018-01-30 ENCOUNTER — Ambulatory Visit
Admission: RE | Admit: 2018-01-30 | Discharge: 2018-01-30 | Disposition: A | Discharge status: Home / Self Care | Payer: 59 | Source: Ambulatory Visit | Attending: Family Medicine | Admitting: Family Medicine

## 2018-01-30 DIAGNOSIS — R1031 Right lower quadrant pain: Secondary | ICD-10-CM

## 2018-02-03 ENCOUNTER — Telehealth: Payer: Self-pay

## 2018-02-03 NOTE — Telephone Encounter (Signed)
Copied from CRM [id number]. Topic: Inquiry >> Feb 03, 2018 12:34 PM Landry MellowFoltz, Melissa J wrote: Reason for CRM: pt is calling about ultrasound. Please call back at [phone]  He would like someone who can speak spanish to call him back

## 2018-02-03 NOTE — Telephone Encounter (Signed)
Provider, please review and release results.  

## 2018-02-04 NOTE — Telephone Encounter (Signed)
Phone call to patient with Spanish interpreter Kern AlbertaSergio [phone]#[phone]. Relayed message from provider, patient verbalizes understanding. Patient states he still has pain. Has not tried anything for pain due to waiting for results. Patient states, "Does this mean you're going to give me some sort of treatment?" Patient advised per last office note provider recommends returning to clinic if symptoms have worsened or have not improved. He states he will return call to schedule appointment for later this week on Friday.

## 2018-02-04 NOTE — Telephone Encounter (Signed)
Please let him know that his ultrasound was normal, no evidence of hernia or any other issues of concern. Pain is therefore most likely muscular in nature as we had discussed in clinic. thanks

## 2018-02-06 ENCOUNTER — Ambulatory Visit (INDEPENDENT_AMBULATORY_CARE_PROVIDER_SITE_OTHER): Payer: 59 | Admitting: Urgent Care

## 2018-02-06 ENCOUNTER — Encounter: Payer: Self-pay | Admitting: Urgent Care

## 2018-02-06 VITALS — BP 142/84 | HR 86 | Temp 97.7°F | Resp 16 | Ht 67.0 in | Wt 198.0 lb

## 2018-02-06 DIAGNOSIS — R1031 Right lower quadrant pain: Secondary | ICD-10-CM | POA: Diagnosis not present

## 2018-02-06 DIAGNOSIS — N50811 Right testicular pain: Secondary | ICD-10-CM

## 2018-02-06 LAB — POC MICROSCOPIC URINALYSIS (UMFC): MUCUS RE: ABSENT

## 2018-02-06 NOTE — Patient Instructions (Addendum)
Torsin testicular (Testicular Torsion) La torsin testicular se refiere a la rotacin del cordn espermtico, la arteria y la vena que van al testculo. Esta torsin corta el suministro de Retail buyersangre a todo el saco que contiene los Kingmantestculos, los vasos sanguneos y parte del cordn espermtico (escroto). Se observa con ms frecuencia en los recin nacidos y adolescentes varones. Tambin puede ocurrir antes del nacimiento. Esto requiere tratamiento de Associate Professoremergencia. El testculo generalmente puede salvarse si la torsin se trata dentro de las 6 horas del inicio. Si se deja sin tratar durante un largo tiempo, el testculo dejar de funcionar y tendr que ser extirpado. CAUSAS La causa de la torsin puede ser un golpe en el escroto o ciertos movimientos durante el ejercicio. En algunos hombres es ms frecuente debido a que la conexin de sus testculos a un tejido Agricultural engineerespecfico en el escroto se desarrollan en el lugar incorrecto, lo que permite que el testculo rote y que el cordn se retuerza. SIGNOS Y SNTOMAS El sntoma principal de la torsin testicular es Chief Technology Officerel dolor en el testculo. El escroto puede estar hinchado, rojo, endurecido y Taylor Landingmuy sensible. Puede haber exceso de lquido en el tejido (edema).El testculo puede estar ms alto que lo normal en el escroto. La piel del escroto puede estar adherida al testculo. Puede tener nuseas, vmitos y Newtonfiebre. DIAGNSTICO Generalmente la torsin testicular se diagnostica a travs de un examen fsico. En algunos casos se realizan estudios por imgenes y pruebas para medir el flujo sanguneo. TRATAMIENTO Podr realizarse un enderezamiento manual si el testculo an est mvil y la maniobra no es muy dolorosa. Sin embargo, generalmente se necesita una ciruga, que debe realizarse lo antes posible despus de que ocurre la torsin. Durante la ciruga, el testculo se endereza y se evala la posibilidad de extirparlo. Esta informacin no tiene Theme park managercomo fin reemplazar el consejo  del mdico. Asegrese de hacerle al mdico cualquier pregunta que tenga. Document Released: 01/30/2009 Document Revised: 06/16/2013 Document Reviewed: 04/05/2013 Elsevier Interactive Patient Education  2017 [employer]     IF you received an x-ray today, you will receive an invoice from [facility]. Please contact [facility] at [phone] with questions or concerns regarding your invoice.   IF you received labwork today, you will receive an invoice from [facility]. Please contact [facility] at [phone] with questions or concerns regarding your invoice.   Our billing staff will not be able to assist you with questions regarding bills from these companies.  You will be contacted with the lab results as soon as they are available. The fastest way to get your results is to activate your My Chart account. Instructions are located on the last page of this paperwork. If you have not heard from us regarding the results in 2 weeks, please contact this office.

## 2018-02-06 NOTE — Progress Notes (Signed)
    MRN: [id number] DOB: 05-01-1971  Subjective:   Nathaniel Davidson is a 47 y.o. male presenting for 4-week history of right groin pain now having right testicular pain.  Patient has been seen before for the same, last office visit was January 23, 2018.  Patient has had a pelvic ultrasound that was negative for hernia.  He is also worried about having testicular torsion.  Denies fever, dysuria, hematuria, urinary frequency, penile discharge, penile swelling, testicular pain, testicular swelling, anal pain, groin pain.  He has had a PSA level done in the past and was completely normal.  He has also had a DRE exam several years ago and is worried about his prostate again today.  Nathaniel Davidson has a current medication list which includes the following prescription(s): pantoprazole. Also has No Known Allergies.  Nathaniel Davidson has past medical of GERD and denies past surgical history.   Objective:   Vitals: BP (!) 142/84   Pulse 86   Temp 97.7 F (36.5 C) (Oral)   Resp 16   Ht 5\' 7"  (1.702 m)   Wt 198 lb (89.8 kg)   SpO2 100%   BMI 31.01 kg/m   Physical Exam  Constitutional: He is oriented to person, place, and time. He appears well-developed and well-nourished.  Cardiovascular: Normal rate.  Pulmonary/Chest: Effort normal.  Abdominal: Hernia confirmed negative in the right inguinal area and confirmed negative in the left inguinal area.  Genitourinary: Right testis shows no mass, no swelling and no tenderness. Right testis is descended. Cremasteric reflex is not absent on the right side. Left testis shows no mass, no swelling and no tenderness. Left testis is descended. Cremasteric reflex is not absent on the left side. Uncircumcised. No phimosis, paraphimosis, hypospadias, penile erythema or penile tenderness. No discharge found.  Lymphadenopathy: No inguinal adenopathy noted on the right or left side.  Neurological: He is alert and oriented to person, place, and time.   Results for orders placed  or performed in visit on 02/06/18 (from the past 24 hour(s))  POCT Microscopic Urinalysis ([facility])     Status: None   Collection Time: 02/06/18  3:57 PM  Result Value Ref Range   WBC,UR,HPF,POC None None WBC/hpf   RBC,UR,HPF,POC None None RBC/hpf   Bacteria None None, Too numerous to count   Mucus Absent Absent   Epithelial Cells, UR Per Microscopy None None, Too numerous to count cells/hpf     Assessment and Plan :   Right testicular pain - Plan: POCT Microscopic Urinalysis ([facility]), US PELVIC DOPPLER (TORSION R/O OR MASS ARTERIAL FLOW), US Scrotum, Ambulatory referral to Urology, GC/Chlamydia Probe Amp([facility]), Trichomonas vaginalis, RNA  Right groin pain  Will refer to urology in the meantime try to obtain testicular ultrasound.  Labs pending.  Wallis BambergMario Kadarious Dikes, PA-C Primary Care at [facility] [phone] 02/06/2018  3:17 PM

## 2018-02-08 LAB — TRICHOMONAS VAGINALIS, PROBE AMP: TRICH VAG BY NAA: NEGATIVE

## 2018-02-08 LAB — GC/CHLAMYDIA PROBE AMP
Chlamydia trachomatis, NAA: NEGATIVE
NEISSERIA GONORRHOEAE BY PCR: NEGATIVE

## 2018-02-09 ENCOUNTER — Telehealth: Payer: Self-pay | Admitting: Urgent Care

## 2018-02-09 NOTE — Telephone Encounter (Signed)
Left a vm on pt answering machine letting him know that mani wanted him to get these 2 U/S  Scheduled within a week. Told him to call back

## 2018-02-12 ENCOUNTER — Other Ambulatory Visit: Payer: Self-pay | Admitting: *Deleted

## 2018-02-12 ENCOUNTER — Ambulatory Visit
Admission: RE | Admit: 2018-02-12 | Discharge: 2018-02-12 | Disposition: A | Discharge status: Home / Self Care | Payer: 59 | Source: Ambulatory Visit | Attending: Urgent Care | Admitting: Urgent Care

## 2018-02-12 ENCOUNTER — Other Ambulatory Visit: Payer: Self-pay | Admitting: Urgent Care

## 2018-02-12 ENCOUNTER — Other Ambulatory Visit: Payer: Self-pay | Admitting: Family Medicine

## 2018-02-12 ENCOUNTER — Telehealth: Payer: Self-pay | Admitting: Urgent Care

## 2018-02-12 ENCOUNTER — Other Ambulatory Visit: Payer: Self-pay | Admitting: Internal Medicine

## 2018-02-12 DIAGNOSIS — N50811 Right testicular pain: Secondary | ICD-10-CM

## 2018-02-12 NOTE — Telephone Encounter (Signed)
LVM advising that this is for torsion.

## 2018-02-12 NOTE — Telephone Encounter (Signed)
Copied from CRM [id number]. Topic: Quick Communication - See Telephone Encounter >> Feb 12, 2018  8:32 AM Diana EvesHoyt, Maryann B wrote: CRM for notification. See Telephone encounter for: 02/12/18.  [facility] calling for [facility] on the Pelvic US order. They are wanting to verify if it is Torsion or not. If it is not they will need a new code put in per protocol. Sarah's CB# [phone] ext [phone]

## 2018-02-19 ENCOUNTER — Telehealth: Payer: Self-pay

## 2018-02-19 NOTE — Telephone Encounter (Signed)
Copied from CRM [id number]. Topic: Quick Communication - Other Results >> Feb 19, 2018  2:34 PM Oneal GroutSebastian, Jennifer S wrote: Requesting US results, requesting call back from spanish speaker

## 2018-02-23 ENCOUNTER — Telehealth: Payer: Self-pay

## 2018-02-23 NOTE — Telephone Encounter (Signed)
Pt enquiring on status of urology appointment.

## 2018-02-24 NOTE — Telephone Encounter (Signed)
Spoke with pt and advised that Urology referral was sent to [facility] on 4/16. Since they have not yet scheduled pt, gave pt phone number to give them a call and set up appt. I also called [facility] and confirmed referral was received, they just have not been able to work on it yet.

## 2018-04-02 IMAGING — CT CT ABD-PELV W/ CM
2 of 5 series · 16 of 46 positions shown, 18 images · IV contrast (ISOVUE 300)
Comparison: None.

CLINICAL DATA: Four month history of epigastric pain, heartburn and
acid reflux.

EXAM:
CT ABDOMEN AND PELVIS WITH CONTRAST
TECHNIQUE: Multidetector CT imaging of the abdomen and pelvis was performed
using the standard protocol following bolus administration of
intravenous contrast.
CONTRAST:  100mL [id number] IOPAMIDOL ([id number]) INJECTION 61%

[Series 2: abd/pel w · axial · 0.79mm/px · z∈[-442,+8]mm · 13 of 102 slices shown, 15 images]
[im 6/102  soft-tissue]
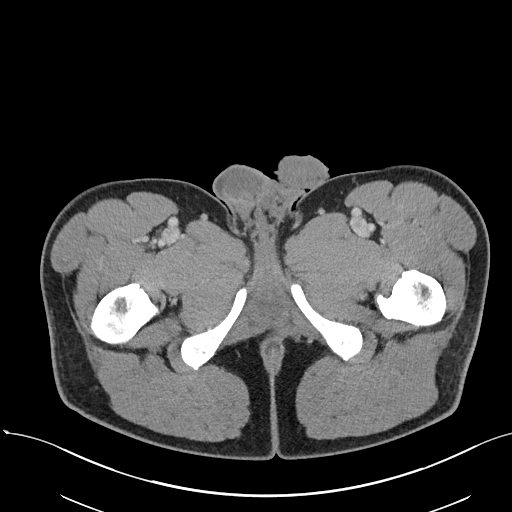
[im 6/102  bone]
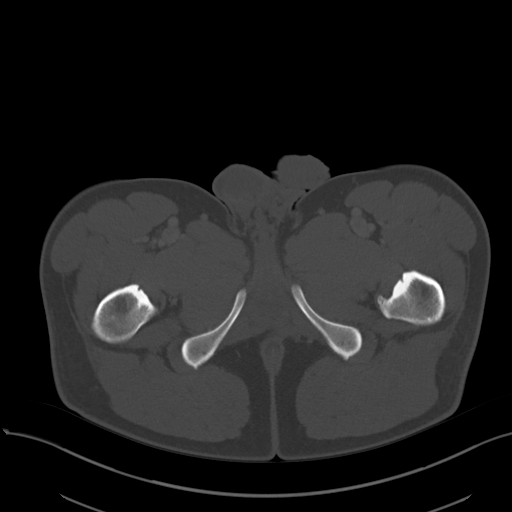
[im 16/102  soft-tissue]
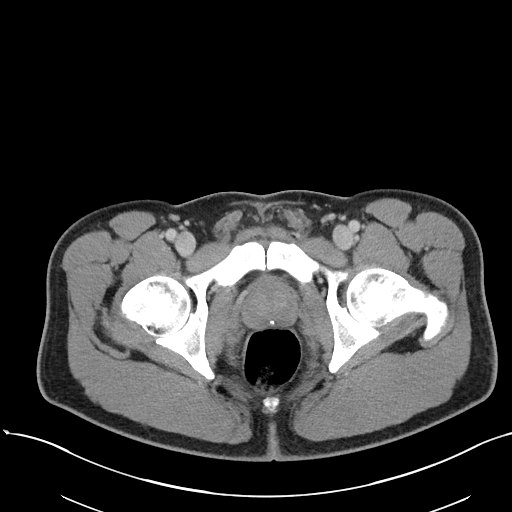
[im 22/102  soft-tissue]
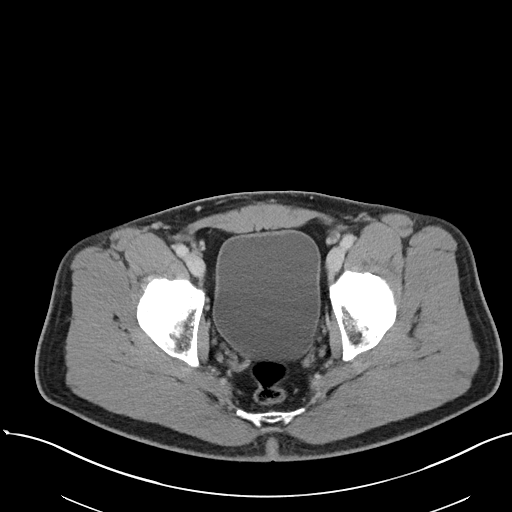
[im 27/102  soft-tissue]
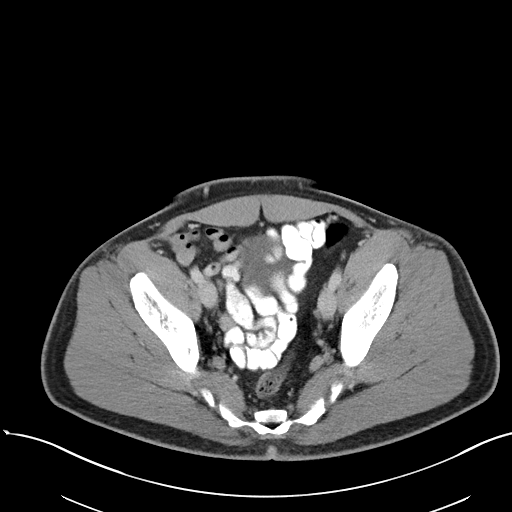
[im 38/102  soft-tissue]
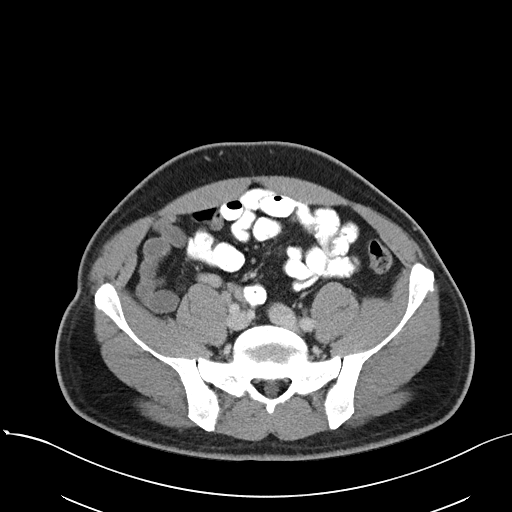
[im 43/102  soft-tissue]
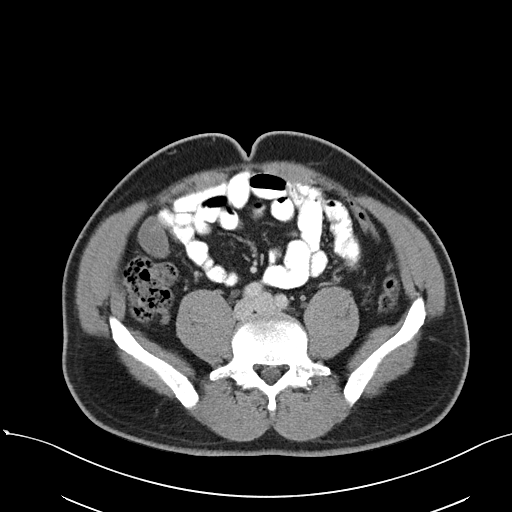
[im 54/102  soft-tissue]
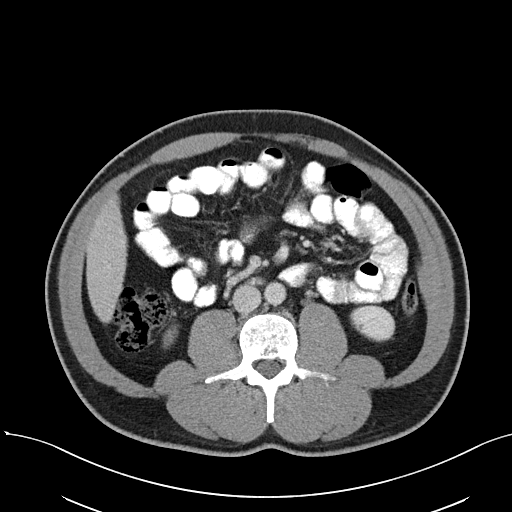
[im 59/102  soft-tissue]
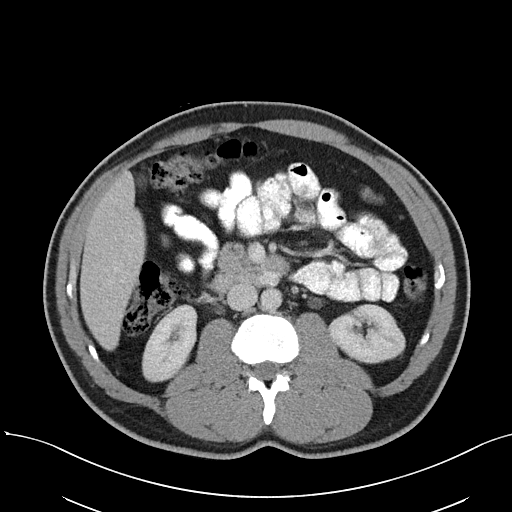
[im 64/102  soft-tissue]
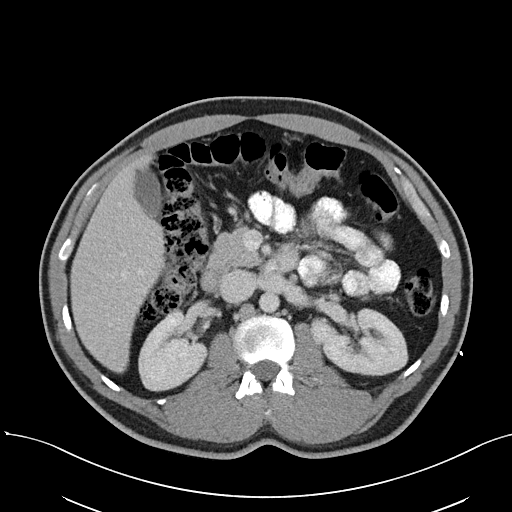
[im 64/102  bone]
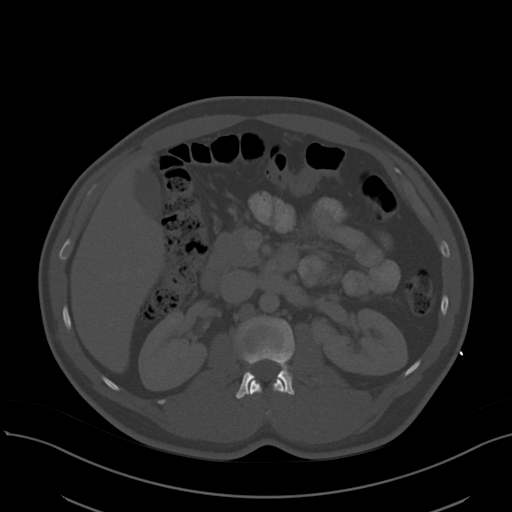
[im 75/102  soft-tissue]
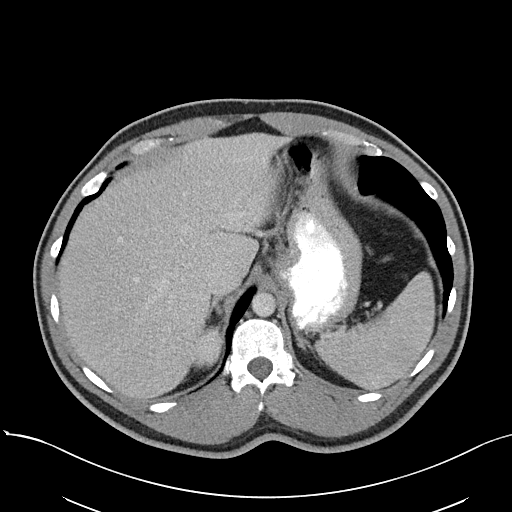
[im 80/102  soft-tissue]
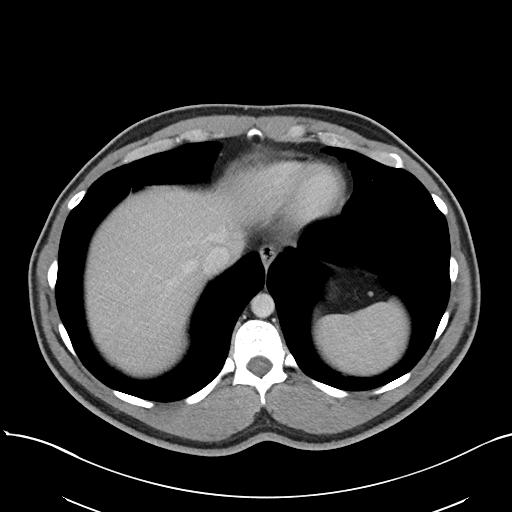
[im 86/102  soft-tissue]
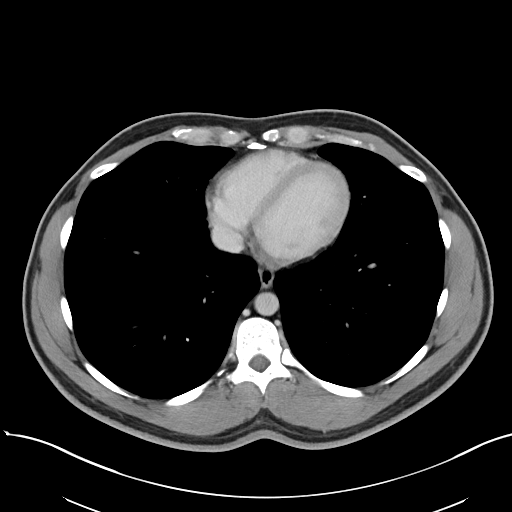
[im 96/102  soft-tissue]
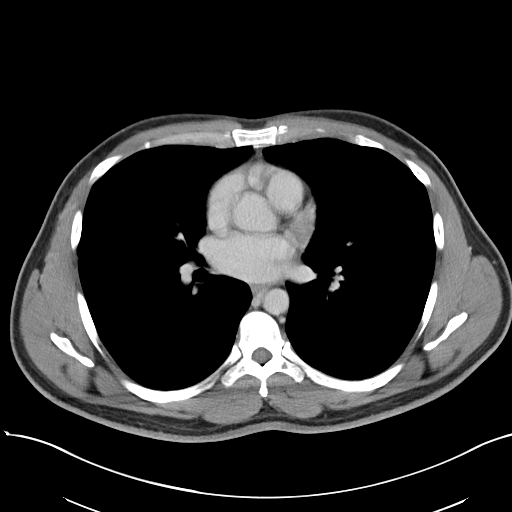

[Series 6: abd/pel w st · coronal · 0.76mm/px · 3 of 89 slices shown]
[im 30/89  soft-tissue]
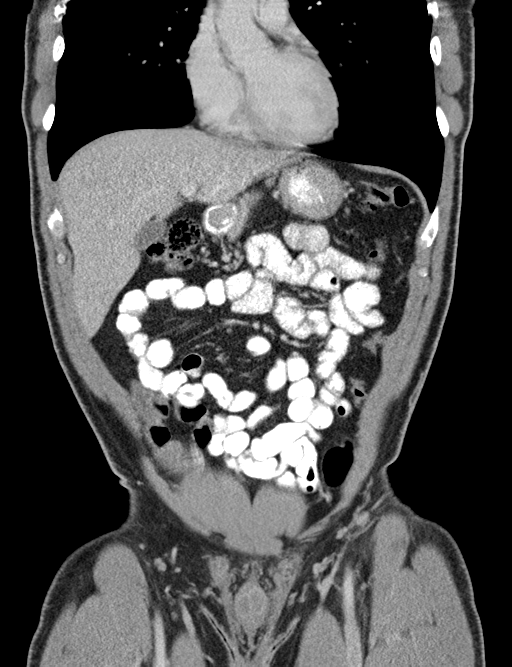
[im 40/89  soft-tissue]
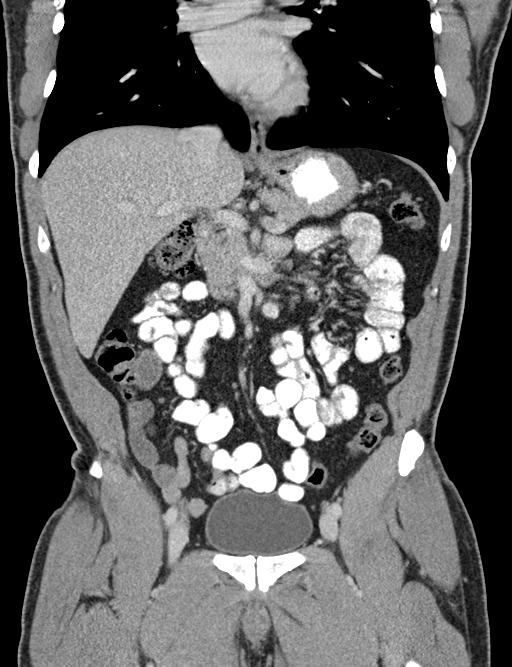
[im 49/89  soft-tissue]
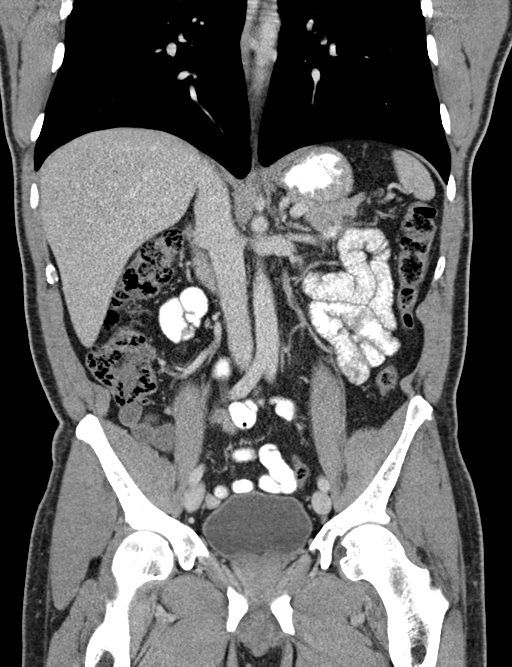

[16 of 46 positions shown; findings below may reference images not displayed]

FINDINGS: Lower chest: Negative.

Hepatobiliary: Liver and gallbladder are unremarkable. No biliary
ductal dilatation.

Pancreas: Negative.

Spleen: Negative.

Adrenals/Urinary Tract: Adrenal glands are unremarkable.
Subcentimeter low-attenuation lesions in the kidneys are too small
to characterize but statistically, cysts are most likely. Ureters
are decompressed. Bladder is grossly unremarkable.

Stomach/Bowel: Stomach, small bowel, appendix and colon are
unremarkable. No hernia.

Vascular/Lymphatic: Vascular structures are unremarkable. No
pathologically enlarged lymph nodes.

Reproductive: Prostate is visualized.

Other: No free fluid.  Mesenteries and peritoneum are unremarkable.

Musculoskeletal: No worrisome lytic or sclerotic lesions.
IMPRESSION: No findings to explain the patient's symptoms.

## 2018-04-26 ENCOUNTER — Other Ambulatory Visit: Payer: Self-pay | Admitting: Emergency Medicine

## 2018-04-26 DIAGNOSIS — L709 Acne, unspecified: Secondary | ICD-10-CM

## 2018-04-27 NOTE — Telephone Encounter (Signed)
Patient called and advised he will need to be seen in the office before refill of Clindamycin for acne, he verbalized understanding. He says he is available to come in on Wednesday at 1720. I advised I will have to get approval for that appointment by Dr. Alvy BimlerSagardia and someone will call back with the appointment time, he verbalized understanding.

## 2018-04-27 NOTE — Telephone Encounter (Signed)
Please advise, thank you.

## 2018-06-06 ENCOUNTER — Ambulatory Visit: Payer: 59 | Admitting: Emergency Medicine

## 2018-06-08 ENCOUNTER — Ambulatory Visit: Payer: 59 | Admitting: Emergency Medicine

## 2018-06-08 ENCOUNTER — Ambulatory Visit (INDEPENDENT_AMBULATORY_CARE_PROVIDER_SITE_OTHER): Payer: 59 | Admitting: Emergency Medicine

## 2018-06-08 ENCOUNTER — Encounter: Payer: Self-pay | Admitting: Emergency Medicine

## 2018-06-08 VITALS — BP 118/76 | HR 64 | Temp 98.0°F | Resp 16 | Ht 68.0 in | Wt 195.2 lb

## 2018-06-08 DIAGNOSIS — E785 Hyperlipidemia, unspecified: Secondary | ICD-10-CM | POA: Insufficient documentation

## 2018-06-08 MED ORDER — PRAVASTATIN SODIUM 40 MG PO TABS: 40.0000 mg | ORAL_TABLET | Freq: Every day | ORAL | 3 refills | Status: DC

## 2018-06-08 NOTE — Patient Instructions (Addendum)
IF you received an x-ray today, you will receive an invoice from [facility]. Please contact [facility] at [phone] with questions or concerns regarding your invoice.   IF you received labwork today, you will receive an invoice from [facility]. Please contact [facility] at [phone] with questions or concerns regarding your invoice.   Our billing staff will not be able to assist you with questions regarding bills from these companies.  You will be contacted with the lab results as soon as they are available. The fastest way to get your results is to activate your My Chart account. Instructions are located on the last page of this paperwork. If you have not heard from us regarding the results in 2 weeks, please contact this office.     Colesterol Cholesterol El colesterol es grasa. El organismo necesita una pequea cantidad de colesterol. El colesterol (placa) puede acumularse en los vasos sanguneos (arterias). Esto lo hace ms propenso a sufrir infartos de miocardio o accidentes cerebrovasculares. No puede sentir el nivel de colesterol. La nica forma de saber si el nivel es alto es mediante un anlisis de Bellefontesangre. Guarde los CDW Corporationresultados del anlisis. Trabaje con el mdico para Engineer, maintenance (IT)mantener el colesterol en un nivel adecuado. Qu significan los resultados?  El colesterol total es la cantidad de colesterol que hay en la Spicersangre.  El LDL es el colesterol Dublinmalo. Este tipo es el que puede acumularse. Intente mantener el nivel de LDL bajo.  El HDL es el colesterol bueno. Limpia los vasos sanguneos y elimina el colesterol LDL. Intente mantener el nivel de HDL alto.  Los triglicridos son grasas que el organismo puede Academic librarianalmacenar o quemar como fuente de Engineer, drillingenerga. Cules son los niveles buenos de colesterol?  El colesterol total debe estar por debajo de 200.  Lo ms aconsejable para quienes tienen riesgos para la salud es mantener el nivel de LDL por debajo de 100. Lo ms  aconsejable para quienes tienen muchos riesgos para la salud es mantener el nivel de LDL por debajo de 70.  Se aconseja mantener un nivel de HDL es por encima de 40. Pero lo mejor es tener un nivel de HDL de 60 o superior.  Los triglicridos por debajo de 150. Cmo puedo bajar el colesterol? Dieta Siga el programa de alimentacin que el mdico le indique.  Elija pescados o carne blanca de pollo y pavo asados u horneados. Intente no comer carne roja, alimentos fritos, salchichas ni embutidos.  Coma gran cantidad de frutas y verduras frescas.  Elija los cereales integrales, los frijoles, las pastas, las papas y los cereales.  Elija aceite de oliva, aceite de maz o aceite de canola. Solo use poca cantidad.  Intente no comer Duncanmantequilla, Sharpsburgmayonesa, Indiamargarina o aceites de Aliciapalmiste.  Intente no comer alimentos que contengan grasas trans.  Elija alimentos lcteos descremados o sin grasa. ? Beba leche descremada o sin grasa. ? Coma yogur y quesos descremados o sin grasa. ? Intente no consumir leche entera o crema. ? Intente no comer helados, yemas de huevo ni quesos enteros.  Los postres sanos incluyen la torta ngel, los bocadillos de Menlo Park Terracejengibre, las Gaffergalletas con forma de [place] Bayanimales, los caramelos duros, los helados de agua y el yogur descremado o sin grasa. Intente no comer masas, tortas, pasteles y galletas.  La prctica de actividad fsica. Siga el programa de actividad fsica que le indique el mdico.  Sea ms Altoonaactivo. Intente hacer los trabajos de Roman Forestjardinera, salir a Advertising account plannercaminar y usar las escaleras.  Consulte  al mdico cmo puede aumentar su actividad.  Medicamentos  Baxter Internationalome los medicamentos de venta libre y los recetados solamente como se lo haya indicado el mdico.  Esta informacin no tiene Theme park managercomo fin reemplazar el consejo del mdico. Asegrese de hacerle al mdico cualquier pregunta que tenga. Document Released: 11/16/2010 Document Revised: 01/13/2017 Document Reviewed:  04/25/2016 Elsevier Interactive Patient Education  [employer]2018 Elsevier Inc.

## 2018-06-08 NOTE — Progress Notes (Signed)
Nathaniel Davidson 47 y.o.   Chief Complaint  Patient presents with  . Cholesterol    checked at [facility], cholesterol readings total 262, triglycerides 412, labs drawn on 05/25/18    HISTORY OF PRESENT ILLNESS: This is a 47 y.o. male here for evaluation of high cholesterol.  Recent lipid profile at work showed increased cholesterol and increased triglycerides.  Has been on a statin in the past.  No medication at present time.  Results reviewed with patient.  No history of diabetes or heart disease.  Asymptomatic.  HPI   Prior to Admission medications   Medication Sig Start Date End Date Taking? Authorizing Provider  pantoprazole (PROTONIX) 40 MG tablet Take 1 tablet  before breakfast daily. 09/10/17  Yes Nathaniel Dare, MD  pravastatin (PRAVACHOL) 40 MG tablet Take 1 tablet (40 mg total) by mouth daily. 06/08/18   Nathaniel Quint, MD    No Known Allergies  Patient Active Problem List   Diagnosis Date Noted  . Acute epigastric pain 07/22/2017    No past medical history on file.  No past surgical history on file.  Social History   Socioeconomic History  . Marital status: Married    Spouse name: Not on file  . Number of children: Not on file  . Years of education: Not on file  . Highest education level: Not on file  Occupational History  . Not on file  Social Needs  . Financial resource strain: Not on file  . Food insecurity:    Worry: Not on file    Inability: Not on file  . Transportation needs:    Medical: Not on file    Non-medical: Not on file  Tobacco Use  . Smoking status: Never Smoker  . Smokeless tobacco: Never Used  Substance and Sexual Activity  . Alcohol use: Yes    Alcohol/week: 1.0 - 3.0 standard drinks    Types: 1 - 3 Cans of beer per week  . Drug use: No  . Sexual activity: Yes  Lifestyle  . Physical activity:    Days per week: Not on file    Minutes per session: Not on file  . Stress: Not on file  Relationships    . Social connections:    Talks on phone: Not on file    Gets together: Not on file    Attends religious service: Not on file    Active member of club or organization: Not on file    Attends meetings of clubs or organizations: Not on file    Relationship status: Not on file  . Intimate partner violence:    Fear of current or ex partner: Not on file    Emotionally abused: Not on file    Physically abused: Not on file    Forced sexual activity: Not on file  Other Topics Concern  . Not on file  Social History Narrative  . Not on file    Family History  Problem Relation Age of Onset  . Cancer Father 76       prostate     Review of Systems  Constitutional: Negative.  Negative for chills and fever.  HENT: Negative.  Negative for congestion, sinus pain and sore throat.   Eyes: Negative.  Negative for blurred vision and double vision.  Respiratory: Negative.  Negative for cough and shortness of breath.   Cardiovascular: Negative.  Negative for chest pain, palpitations and leg swelling.  Gastrointestinal: Negative.  Negative for abdominal pain, diarrhea,  heartburn, nausea and vomiting.  Genitourinary: Negative.  Negative for dysuria and hematuria.  Musculoskeletal: Negative.  Negative for back pain, myalgias and neck pain.  Skin: Negative.  Negative for rash.  Neurological: Negative.  Negative for dizziness and headaches.  Endo/Heme/Allergies: Negative.   All other systems reviewed and are negative.   Vitals:   06/08/18 1342  BP: 118/76  Pulse: 64  Resp: 16  Temp: 98 F (36.7 C)  SpO2: 97%    Physical Exam  Constitutional: He is oriented to person, place, and time. He appears well-developed and well-nourished.  HENT:  Head: Normocephalic and atraumatic.  Nose: Nose normal.  Mouth/Throat: Oropharynx is clear and moist.  Eyes: Pupils are equal, round, and reactive to light. Conjunctivae and EOM are normal.  Neck: Normal range of motion. Neck supple. No JVD present. No  thyromegaly present.  Cardiovascular: Normal rate, regular rhythm and normal heart sounds.  Pulmonary/Chest: Effort normal and breath sounds normal.  Abdominal: Soft. Bowel sounds are normal. There is no tenderness.  Musculoskeletal: Normal range of motion. He exhibits no edema or tenderness.  Lymphadenopathy:    He has no cervical adenopathy.  Neurological: He is alert and oriented to person, place, and time. No sensory deficit. He exhibits normal muscle tone.  Skin: Skin is warm and dry. Capillary refill takes less than 2 seconds.  Psychiatric: He has a normal mood and affect. His behavior is normal.  Vitals reviewed.   A total of 25 minutes was spent in the room with the patient, greater than 50% of which was in counseling/coordination of care regarding treatment of high cholesterol, medications, nutrition and diet, and need for follow-up.  ASSESSMENT & PLAN: Nathaniel CollarRodolfo was seen today for cholesterol.  Diagnoses and all orders for this visit:  Dyslipidemia  Other orders -     pravastatin (PRAVACHOL) 40 MG tablet; Take 1 tablet (40 mg total) by mouth daily.    Patient Instructions       IF you received an x-ray today, you will receive an invoice from [facility]. Please contact [facility] at [phone] with questions or concerns regarding your invoice.   IF you received labwork today, you will receive an invoice from [facility]. Please contact [facility] at [phone] with questions or concerns regarding your invoice.   Our billing staff will not be able to assist you with questions regarding bills from these companies.  You will be contacted with the lab results as soon as they are available. The fastest way to get your results is to activate your My Chart account. Instructions are located on the last page of this paperwork. If you have not heard from us regarding the results in 2 weeks, please contact this office.     Colesterol Cholesterol El  colesterol es grasa. El organismo necesita una pequea cantidad de colesterol. El colesterol (placa) puede acumularse en los vasos sanguneos (arterias). Esto lo hace ms propenso a sufrir infartos de miocardio o accidentes cerebrovasculares. No puede sentir el nivel de colesterol. La nica forma de saber si el nivel es alto es mediante un anlisis de [place]angre. Guarde los CDW Corporationresultados del anlisis. Trabaje con el mdico para Engineer, maintenance (IT)mantener el colesterol en un nivel adecuado. Qu significan los resultados?  El colesterol total es la cantidad de colesterol que hay en la Roy[place]angre.  El LDL es el colesterol Rosebushmalo. Este tipo es el que puede acumularse. Intente mantener el nivel de LDL bajo.  El HDL es el colesterol bueno. Limpia los vasos sanguneos y elimina  el colesterol LDL. Intente mantener el nivel de HDL alto.  Los triglicridos son grasas que el organismo puede Academic librarianalmacenar o quemar como fuente de Engineer, drillingenerga. Cules son los niveles buenos de colesterol?  El colesterol total debe estar por debajo de 200.  Lo ms aconsejable para quienes tienen riesgos para la salud es mantener el nivel de LDL por debajo de 100. Lo ms aconsejable para quienes tienen muchos riesgos para la salud es mantener el nivel de LDL por debajo de 70.  Se aconseja mantener un nivel de HDL es por encima de 40. Pero lo mejor es tener un nivel de HDL de 60 o superior.  Los triglicridos por debajo de 150. Cmo puedo bajar el colesterol? Dieta Siga el programa de alimentacin que el mdico le indique.  Elija pescados o carne blanca de pollo y pavo asados u horneados. Intente no comer carne roja, alimentos fritos, salchichas ni embutidos.  Coma gran cantidad de frutas y verduras frescas.  Elija los cereales integrales, los frijoles, las pastas, las papas y los cereales.  Elija aceite de oliva, aceite de maz o aceite de canola. Solo use poca cantidad.  Intente no comer [place]illa, Summitmayonesa, Indiamargarina o aceites de  [place]palmiste.  Intente no comer alimentos que contengan grasas trans.  Elija alimentos lcteos descremados o sin grasa. ? Beba leche descremada o sin grasa. ? Coma yogur y quesos descremados o sin grasa. ? Intente no consumir leche entera o crema. ? Intente no comer helados, yemas de huevo ni quesos enteros.  Los postres sanos incluyen la torta ngel, los bocadillos de [place] Cityjengibre, las Gaffergalletas con forma de Annaanimales, los caramelos duros, los helados de agua y el yogur descremado o sin grasa. Intente no comer masas, tortas, pasteles y galletas.  La prctica de actividad fsica. Siga el programa de actividad fsica que le indique el mdico.  Sea ms [place]activo. Intente hacer los trabajos de Raganjardinera, salir a Advertising account plannercaminar y usar las escaleras.  Consulte al mdico cmo puede aumentar su actividad.  Medicamentos  Baxter Internationalome los medicamentos de venta libre y los recetados solamente como se lo haya indicado el mdico.  Esta informacin no tiene Theme park managercomo fin reemplazar el consejo del mdico. Asegrese de hacerle al mdico cualquier pregunta que tenga. Document Released: 11/16/2010 Document Revised: 01/13/2017 Document Reviewed: 04/25/2016 Elsevier Interactive Patient Education  2018 Elsevier Inc.      Edwina BarthMiguel Velta Rockholt, MD [facility]

## 2018-09-05 ENCOUNTER — Other Ambulatory Visit: Payer: Self-pay | Admitting: Gastroenterology

## 2018-12-11 ENCOUNTER — Ambulatory Visit: Payer: 59 | Admitting: Emergency Medicine

## 2019-10-26 ENCOUNTER — Ambulatory Visit: Payer: 59 | Attending: Internal Medicine

## 2019-10-26 DIAGNOSIS — Z20822 Contact with and (suspected) exposure to covid-19: Secondary | ICD-10-CM

## 2019-10-27 LAB — NOVEL CORONAVIRUS, NAA: SARS-CoV-2, NAA: DETECTED — AB

## 2019-12-17 IMAGING — US US SCROTUM W/ DOPPLER COMPLETE
1 series · 14 of 25 positions shown · non-contrast
Comparison: None.

CLINICAL DATA: Initial evaluation for right testicular pain for 5
months.

EXAM:
SCROTAL ULTRASOUND
DOPPLER ULTRASOUND OF THE TESTICLES
TECHNIQUE: Complete ultrasound examination of the testicles, epididymis, and
other scrotal structures was performed. Color and spectral Doppler
ultrasound were also utilized to evaluate blood flow to the
testicles.

[Series 1: us scrotum w/ doppler complete · 0.07mm/px · 14 of 45 slices shown]
[im 1/45]
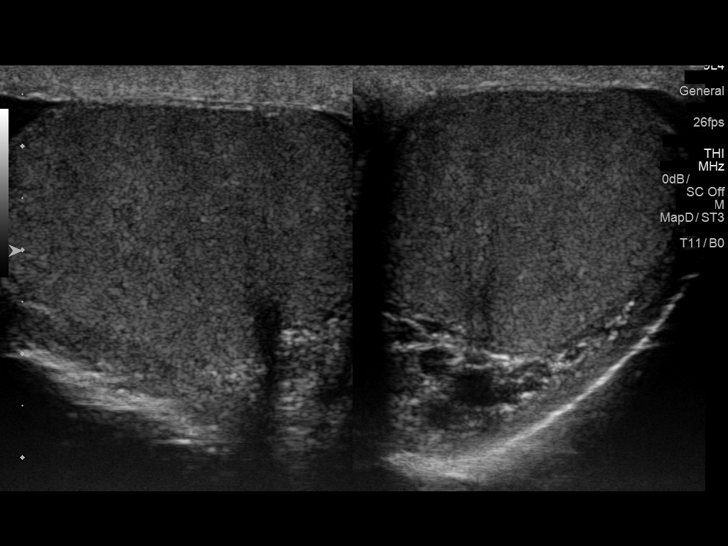
[im 4/45]
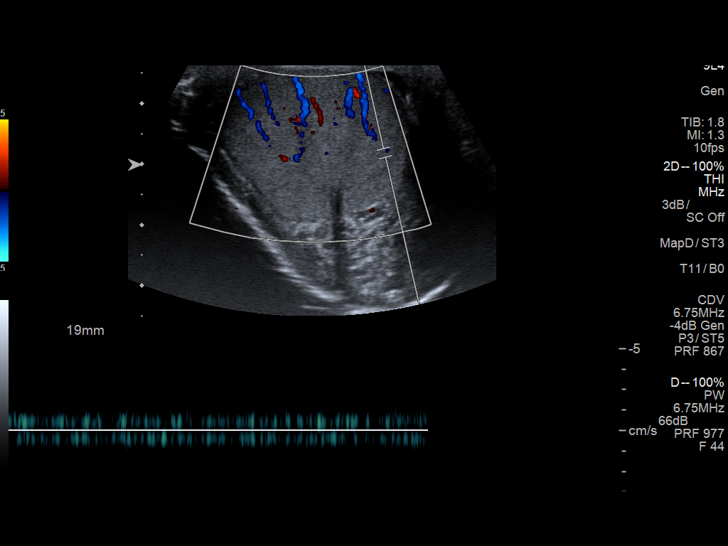
[im 8/45]
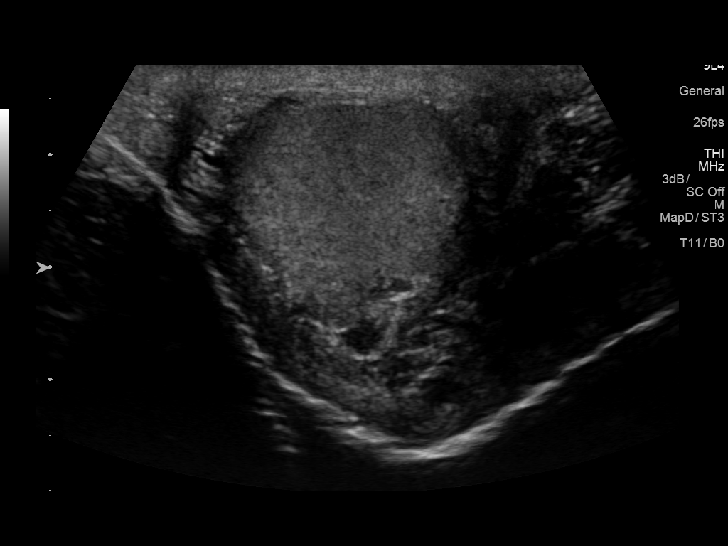
[im 12/45]
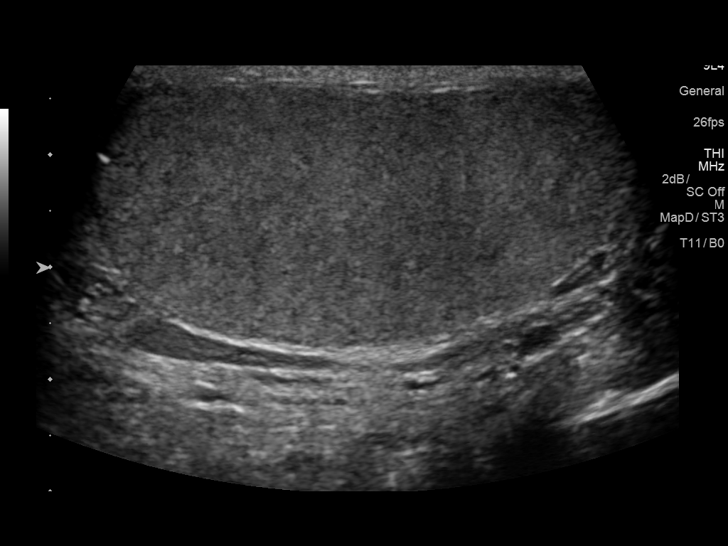
[im 15/45]
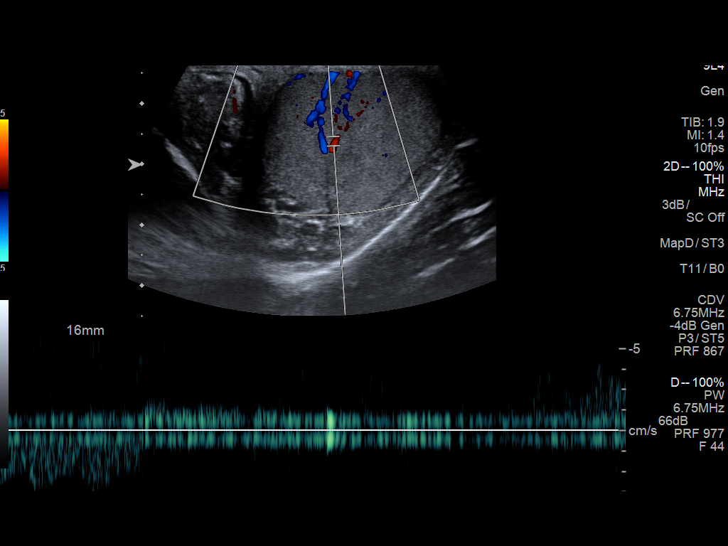
[im 17/45]
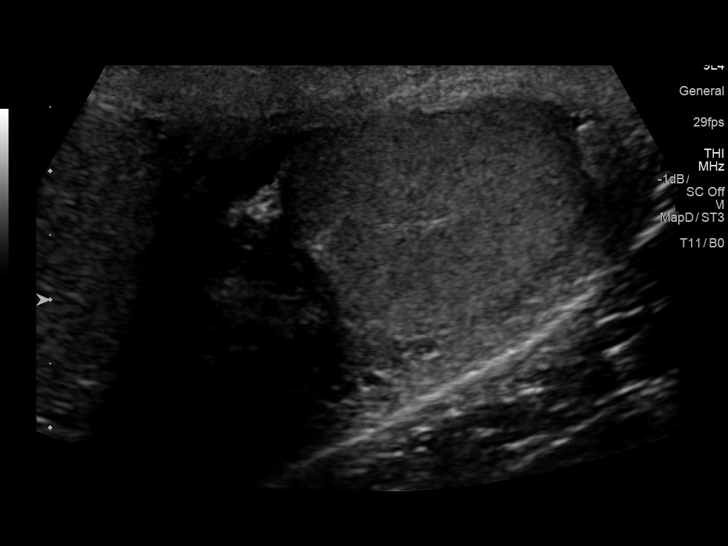
[im 21/45]
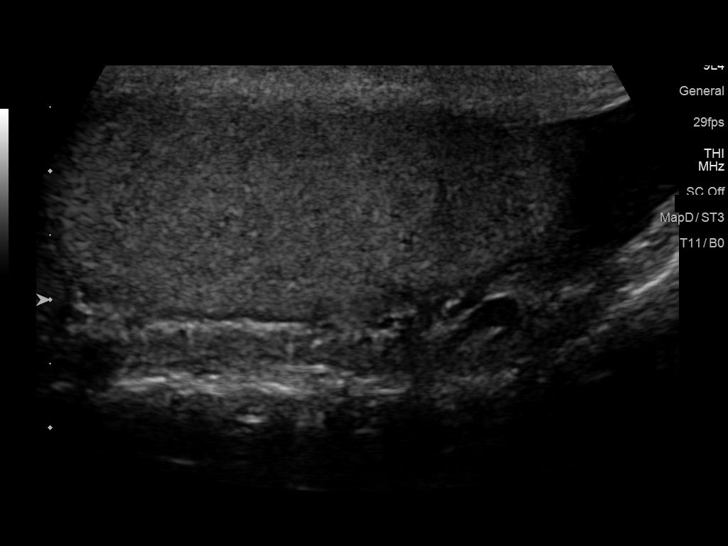
[im 24/45]
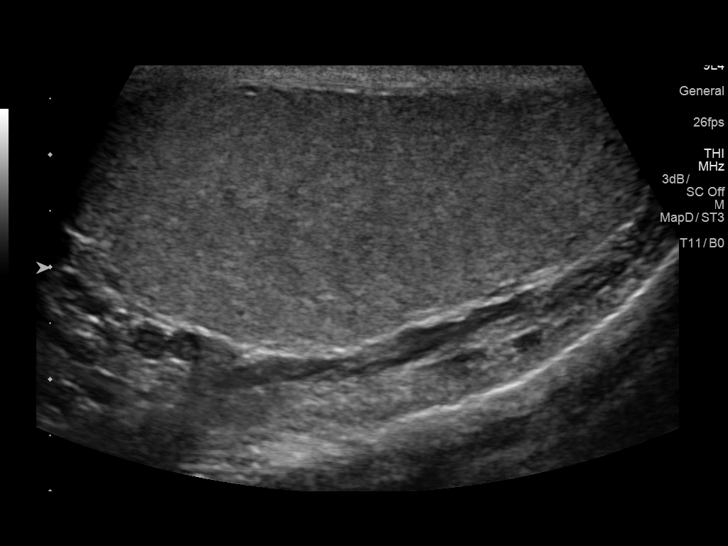
[im 28/45]
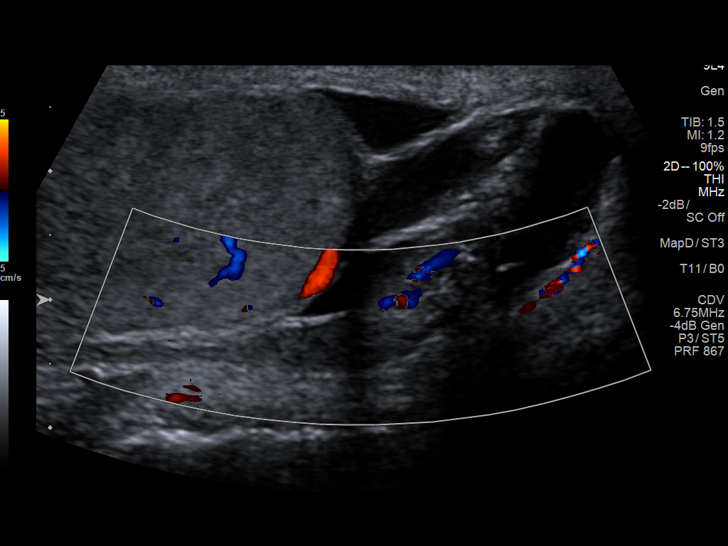
[im 30/45]
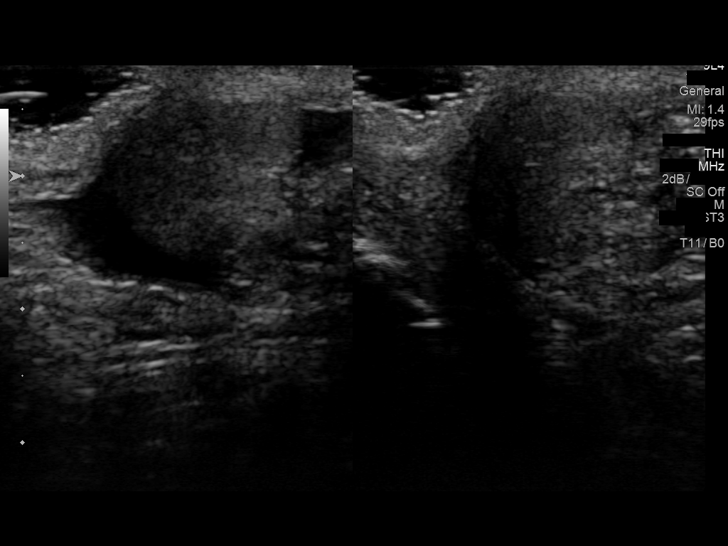
[im 34/45]
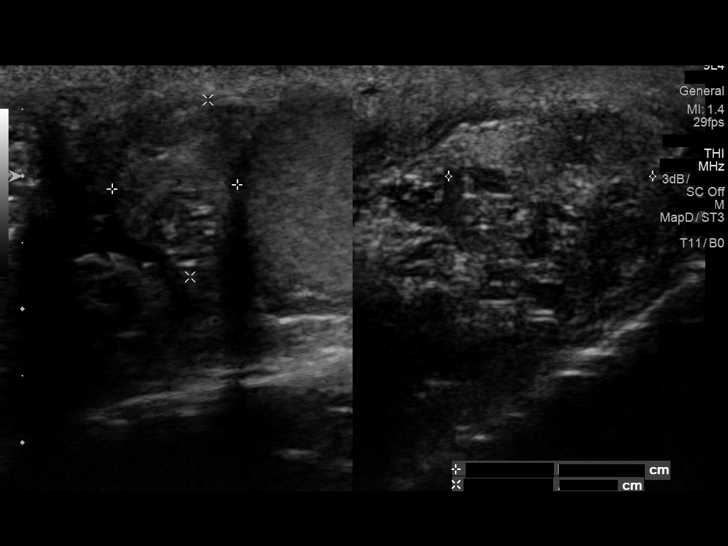
[im 37/45]
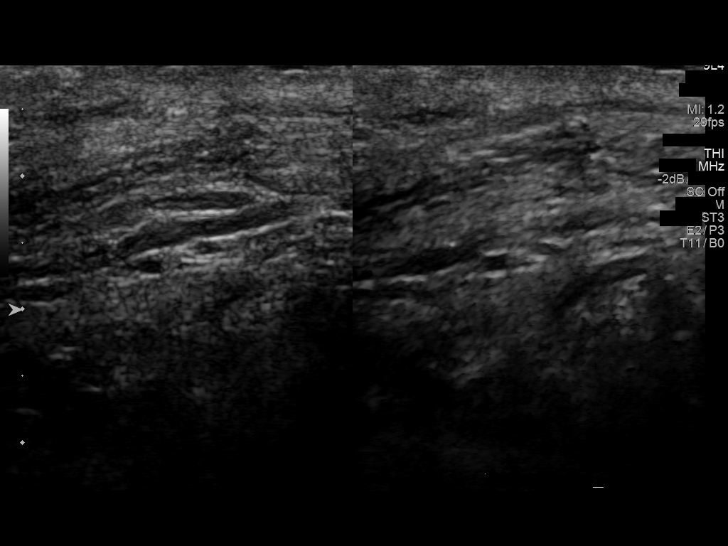
[im 41/45]
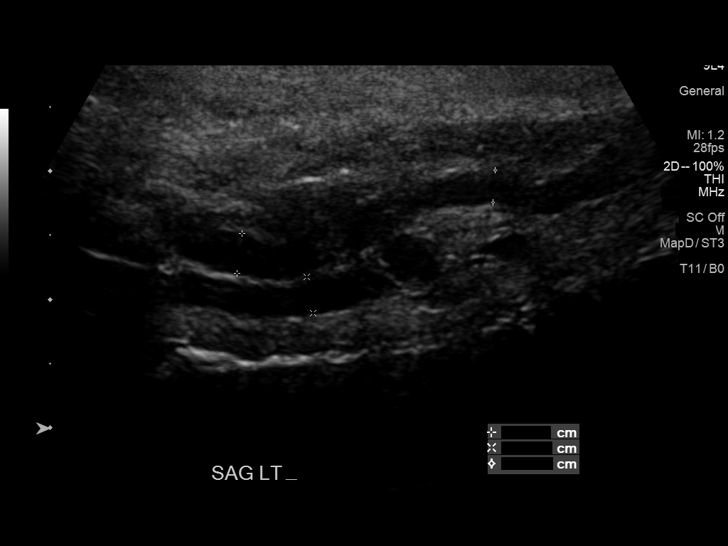
[im 45/45]
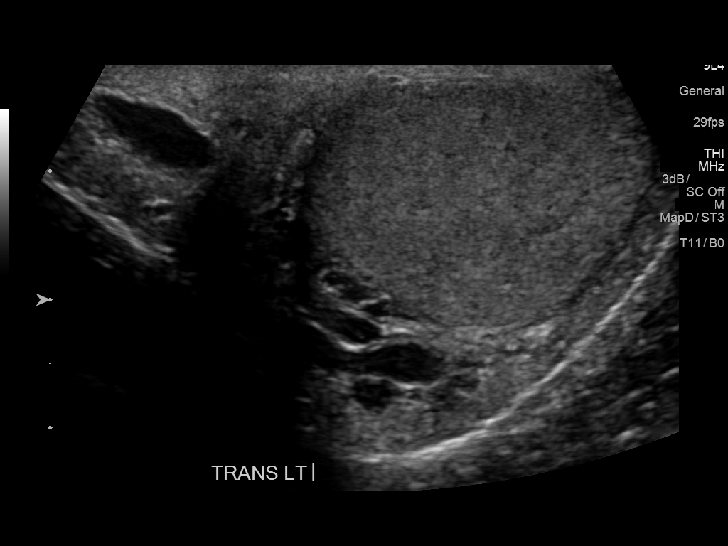

[14 of 25 positions shown; findings below may reference images not displayed]

FINDINGS: Right testicle

Measurements: 4.8 x 2.3 x 3.3 cm. No mass or microlithiasis
visualized.

Left testicle

Measurements: 5.0 x 2.3 x 3.0 cm. No mass or microlithiasis
visualized.

Right epididymis:  Normal in size and appearance.

Left epididymis:  Normal in size and appearance.

Hydrocele:  None visualized.

Varicocele:  None visualized.

Pulsed Doppler interrogation of both testes demonstrates normal low
resistance arterial and venous waveforms bilaterally.
IMPRESSION: Normal scrotal ultrasound. No testicular mass or other acute
abnormality.

## 2020-08-07 ENCOUNTER — Other Ambulatory Visit: Payer: Self-pay

## 2020-08-07 ENCOUNTER — Encounter: Payer: Self-pay | Admitting: Emergency Medicine

## 2020-08-07 ENCOUNTER — Ambulatory Visit (INDEPENDENT_AMBULATORY_CARE_PROVIDER_SITE_OTHER): Payer: 59 | Admitting: Emergency Medicine

## 2020-08-07 VITALS — BP 124/71 | HR 71 | Temp 98.0°F | Resp 16 | Ht 69.25 in | Wt 190.0 lb

## 2020-08-07 DIAGNOSIS — Z23 Encounter for immunization: Secondary | ICD-10-CM

## 2020-08-07 DIAGNOSIS — E785 Hyperlipidemia, unspecified: Secondary | ICD-10-CM

## 2020-08-07 DIAGNOSIS — Z1211 Encounter for screening for malignant neoplasm of colon: Secondary | ICD-10-CM

## 2020-08-07 DIAGNOSIS — R1011 Right upper quadrant pain: Secondary | ICD-10-CM | POA: Diagnosis not present

## 2020-08-07 DIAGNOSIS — Z1159 Encounter for screening for other viral diseases: Secondary | ICD-10-CM

## 2020-08-07 DIAGNOSIS — Z20822 Contact with and (suspected) exposure to covid-19: Secondary | ICD-10-CM

## 2020-08-07 MED ORDER — ROSUVASTATIN CALCIUM 10 MG PO TABS: 10.0000 mg | ORAL_TABLET | Freq: Every day | ORAL | 3 refills | Status: DC

## 2020-08-07 NOTE — Progress Notes (Signed)
Nathaniel Davidson 49 y.o.   Chief Complaint  Patient presents with  . Abdominal Pain    on the right side for 3 months, denies any constipation    HISTORY OF PRESENT ILLNESS: This is a 49 y.o. male complaining of intermittent pain to right upper abdomen for the past 3 months.  No associated symptoms.  Able to eat and drink.  Denies loss of appetite, nausea or vomiting, diarrhea, blood in the stools, fever or chills.  No trigger.  HPI   Prior to Admission medications   Medication Sig Start Date End Date Taking? Authorizing Provider  pantoprazole (PROTONIX) 40 MG tablet TAKE 1 TABLET BY MOUTH DAILY BEFORE BREAFFAST NEED APPT FOR FURTHER REFILLS Patient not taking: Reported on 08/07/2020 09/07/18   Meryl Dare, MD  pravastatin (PRAVACHOL) 40 MG tablet Take 1 tablet (40 mg total) by mouth daily. Patient not taking: Reported on 08/07/2020 06/08/18   Georgina Quint, MD    No Known Allergies  Patient Active Problem List   Diagnosis Date Noted  . Dyslipidemia 06/08/2018    History reviewed. No pertinent past medical history.  History reviewed. No pertinent surgical history.  Social History   Socioeconomic History  . Marital status: Married    Spouse name: Not on file  . Number of children: Not on file  . Years of education: Not on file  . Highest education level: Not on file  Occupational History  . Not on file  Tobacco Use  . Smoking status: Never Smoker  . Smokeless tobacco: Never Used  Substance and Sexual Activity  . Alcohol use: Yes    Alcohol/week: 1.0 - 3.0 standard drink    Types: 1 - 3 Cans of beer per week  . Drug use: No  . Sexual activity: Yes  Other Topics Concern  . Not on file  Social History Narrative  . Not on file   Social Determinants of Health   Financial Resource Strain:   . Difficulty of Paying Living Expenses: Not on file  Food Insecurity:   . Worried About Programme researcher, broadcasting/film/video in the Last Year: Not on file  . Ran Out of  Food in the Last Year: Not on file  Transportation Needs:   . Lack of Transportation (Medical): Not on file  . Lack of Transportation (Non-Medical): Not on file  Physical Activity:   . Days of Exercise per Week: Not on file  . Minutes of Exercise per Session: Not on file  Stress:   . Feeling of Stress : Not on file  Social Connections:   . Frequency of Communication with Friends and Family: Not on file  . Frequency of Social Gatherings with Friends and Family: Not on file  . Attends Religious Services: Not on file  . Active Member of Clubs or Organizations: Not on file  . Attends Banker Meetings: Not on file  . Marital Status: Not on file  Intimate Partner Violence:   . Fear of Current or Ex-Partner: Not on file  . Emotionally Abused: Not on file  . Physically Abused: Not on file  . Sexually Abused: Not on file    Family History  Problem Relation Age of Onset  . Cancer Father 34       prostate     Review of Systems  Constitutional: Negative.  Negative for chills and fever.  HENT: Negative.  Negative for congestion and sore throat.   Respiratory: Negative.  Negative for cough and shortness of  breath.   Cardiovascular: Negative.  Negative for chest pain and palpitations.  Gastrointestinal: Positive for abdominal pain. Negative for blood in stool, diarrhea, heartburn, melena, nausea and vomiting.  Genitourinary: Negative.  Negative for dysuria and hematuria.  Musculoskeletal: Negative.  Negative for back pain, myalgias and neck pain.  Skin: Negative.  Negative for rash.  Neurological: Negative.  Negative for dizziness and headaches.  Endo/Heme/Allergies: Negative.   All other systems reviewed and are negative.   Vitals:   08/07/20 1541  BP: 124/71  Pulse: 71  Resp: 16  Temp: 98 F (36.7 C)  SpO2: 96%    Physical Exam Vitals reviewed.  Constitutional:      Appearance: He is well-developed.  HENT:     Head: Normocephalic.     Mouth/Throat:      Mouth: Mucous membranes are moist.     Pharynx: Oropharynx is clear.  Eyes:     Extraocular Movements: Extraocular movements intact.     Conjunctiva/sclera: Conjunctivae normal.     Pupils: Pupils are equal, round, and reactive to light.  Cardiovascular:     Rate and Rhythm: Normal rate and regular rhythm.     Pulses: Normal pulses.     Heart sounds: Normal heart sounds.  Pulmonary:     Effort: Pulmonary effort is normal.     Breath sounds: Normal breath sounds.  Abdominal:     General: Bowel sounds are normal. There is no distension.     Palpations: Abdomen is soft. There is no mass.     Tenderness: There is no abdominal tenderness. There is no right CVA tenderness, left CVA tenderness, guarding or rebound.     Hernia: No hernia is present.  Musculoskeletal:        General: Normal range of motion.     Cervical back: Normal range of motion and neck supple.  Skin:    General: Skin is warm and dry.     Capillary Refill: Capillary refill takes less than 2 seconds.  Neurological:     General: No focal deficit present.     Mental Status: He is alert and oriented to person, place, and time.  Psychiatric:        Mood and Affect: Mood normal.        Behavior: Behavior normal.    A total of 30 minutes was spent with the patient, greater than 50% of which was in counseling/coordination of care regarding differential diagnosis of right upper abdominal pain, need for diagnostic work-up including blood tests and ultrasound of right upper abdomen, education on nutrition and dyslipidemia, need for cholesterol medication, rosuvastatin 10 mg, review of most recent office visit notes, review of health maintenance items, prognosis, documentation, and need for follow-up.   ASSESSMENT & PLAN: Nathaniel Davidson was seen today for abdominal pain.  Diagnoses and all orders for this visit:  Right upper quadrant abdominal pain -     Comprehensive metabolic panel -     CBC with Differential/Platelet -      Lipase -     US Abdomen Limited RUQ; Future  Need for prophylactic vaccination and inoculation against influenza -     Flu Vaccine QUAD 36+ mos IM  Colon cancer screening -     Ambulatory referral to Gastroenterology  Dyslipidemia -     Lipid panel -     rosuvastatin (CRESTOR) 10 MG tablet; Take 1 tablet (10 mg total) by mouth daily.  Exposure to COVID-19 virus -     SAR CoV2 Serology (  COVID 19)AB(IGG)IA  Need for hepatitis C screening test -     Hepatitis C antibody    Patient Instructions       If you have lab work done today you will be contacted with your lab results within the next 2 weeks.  If you have not heard from us then please contact us. The fastest way to get your results is to register for My Chart.   IF you received an x-ray today, you will [facility] an [facility]. Please contact [facility] at [phone] with questions or concerns regarding your invoice.   IF you received labwork today, you will receive an invoice from [facility]. Please contact [facility] at [phone] with questions or concerns regarding your invoice.   Our billing staff will not be able to assist you with questions regarding bills from these companies.  You will be contacted with the lab results as soon as they are available. The fastest way to get your results is to activate your My Chart account. Instructions are located on the last page of this paperwork. If you have not heard from us regarding the results in 2 weeks, please contact this office.     Mantenimiento de Research officer, political partyla salud en los hombres Health Maintenance, Male Adoptar un estilo de vida saludable y recibir atencin preventiva son importantes para promover la salud y Counsellorel bienestar. Consulte al mdico sobre:  El esquema adecuado para hacerse pruebas y exmenes peridicos.  Cosas que puede hacer por su cuenta para prevenir enfermedades y [place]uffmantenerse sano. Qu debo saber sobre la dieta, el peso y el  ejercicio? Consuma una dieta saludable   Consuma una dieta que incluya muchas verduras, frutas, productos lcteos con bajo contenido de [place]grasa y Associate Professorprotenas magras.  No consuma muchos alimentos ricos en grasas slidas, azcares agregados o sodio. Mantenga un peso saludable El ndice de masa muscular [facility](IMC) es una medida que puede utilizarse para identificar posibles problemas de Brilliantpeso. Proporciona una estimacin de la grasa corporal basndose en el peso y la altura. Su mdico puede ayudarle a Engineer, sitedeterminar su IMC y a Personnel officerlograr o Pharmacologistmantener un peso saludable. Haga ejercicio con regularidad Haga ejercicio con regularidad. Esta es una de las prcticas ms importantes que puede hacer por su salud. La mayora de los adultos deben seguir estas pautas:  Education officer, environmentalealizar, al menos, 150minutos de actividad fsica por semana. El ejercicio debe aumentar la frecuencia cardaca y Media plannerhacerlo transpirar (ejercicio de intensidad moderada).  Hacer ejercicios de fortalecimiento por lo Rite Aidmenos dos veces por semana. Agregue esto a su plan de ejercicio de intensidad moderada.  Pasar menos tiempo sentados. Incluso la actividad fsica ligera puede ser beneficiosa. Controle sus niveles de colesterol y lpidos en la sangre Comience a realizarse anlisis de lpidos y Oncologistcolesterol en la sangre a los 20aos y luego reptalos cada 5aos. Es posible que Insurance underwriternecesite controlar los niveles de colesterol con mayor frecuencia si:  Sus niveles de lpidos y colesterol son altos.  Es mayor de 40aos.  Presenta un alto riesgo de padecer enfermedades cardacas. Qu debo saber sobre las pruebas de deteccin del cncer? Muchos tipos de cncer pueden detectarse de manera temprana y, a menudo, pueden prevenirse. Segn su historia clnica y sus antecedentes familiares, es posible que deba realizarse pruebas de deteccin del cncer en diferentes edades. Esto puede incluir pruebas de deteccin de lo siguiente:  Building services engineerCncer colorrectal.  Cncer de  prstata.  Cncer de piel.  Cncer de pulmn. Qu debo saber sobre la enfermedad cardaca, la diabetes y  la hipertensin arterial? Presin arterial y enfermedad cardaca  La hipertensin arterial causa enfermedades cardacas y [place] el riesgo de accidente cerebrovascular. Es ms probable que esto se manifieste en las personas que tienen lecturas de presin arterial alta, tienen ascendencia africana o tienen sobrepeso.  Hable con el mdico sobre sus valores de presin arterial deseados.  Hgase controlar la presin arterial: ? Cada 3 a 5 aos si tiene entre 18 y 15 aos. ? Todos los aos si es mayor de [place].  Si tiene entre 65 y 53 aos y es fumador o Insurance underwriter, pregntele al mdico si debe realizarse una prueba de deteccin de aneurisma artico abdominal (AAA) por nica vez. Diabetes Realcese exmenes de deteccin de la diabetes con regularidad. Este anlisis revisa el nivel de azcar en la sangre en [place]. Hgase las pruebas de deteccin:  Cada tresaos despus de los 45aos de edad si tiene un peso normal y un bajo riesgo de padecer diabetes.  Con ms frecuencia y a partir de [place] edad inferior si tiene sobrepeso o un alto riesgo de padecer diabetes. Qu debo saber sobre la prevencin de infecciones? Hepatitis B Si tiene un riesgo ms alto de contraer hepatitis B, debe someterse a un examen de deteccin de este virus. Hable con el mdico para averiguar si tiene riesgo de contraer la infeccin por hepatitis B. Hepatitis C Se recomienda un anlisis de [place] para:  Todos los que nacieron entre 1945 y [id number].  Todas las personas que tengan un riesgo de haber contrado hepatitis C. Enfermedades de transmisin sexual (ETS)  Debe realizarse pruebas de deteccin de ITS todos los aos, incluidas la gonorrea y la clamidia, si: ? Es sexualmente activo y es menor de 24aos. ? Es mayor de 24aos, y Public affairs consultant informa que corre riesgo de tener este tipo de infecciones. ? La  actividad sexual ha cambiado desde que le hicieron la ltima prueba de deteccin y tiene un riesgo mayor de Warehouse manager clamidia o Copy. Pregntele al mdico si usted tiene riesgo.  Pregntele al mdico si usted tiene un alto riesgo de Primary school teacher VIH. El mdico tambin puede recomendarle un medicamento recetado para ayudar a evitar la infeccin por el VIH. Si elige tomar medicamentos para prevenir el VIH, primero debe [employer] de deteccin del VIH. Luego debe hacerse anlisis cada mientras est tomando los medicamentos. Siga estas instrucciones en su casa: Estilo de vida  No consuma ningn producto que contenga nicotina o tabaco, como cigarrillos, cigarrillos electrnicos y tabaco de Theatre manager. Si necesita ayuda para dejar de fumar, consulte al mdico.  No consuma drogas.  No comparta agujas.  Solicite ayuda a su mdico si necesita apoyo o informacin para abandonar las drogas. Consumo de alcohol  No beba alcohol si el mdico se lo prohbe.  Si bebe alcohol: ? Limite la cantidad que consume de 0 a 2 medidas por da. ? Est atento a la cantidad de alcohol que hay en las bebidas que toma. En los [place], una medida equivale a una botella de cerveza de 12oz ( ), un vaso de vino de 5oz ( ) o un vaso de una bebida alcohlica de alta graduacin de 1oz (43ml). Instrucciones generales  Realcese los estudios de rutina de la salud, dentales y de Wellsite geologist.  Mantngase al da con las vacunas.  Infrmele a su mdico si: ? Se siente deprimido con frecuencia. ? Alguna vez ha sido vctima de [place] o no se siente seguro en su casa. Resumen  Adoptar un  estilo de vida saludable y recibir atencin preventiva son importantes para promover la salud y Counsellor.  Siga las instrucciones del mdico acerca de una dieta saludable, el ejercicio y la realizacin de pruebas o exmenes para Hotel manager.  Siga las instrucciones del mdico con respecto al control  del colesterol y la presin arterial. Esta informacin no tiene Theme park manager el consejo del mdico. Asegrese de hacerle al mdico cualquier pregunta que tenga. Document Revised: 11/04/2018 Document Reviewed: 11/04/2018 Elsevier Patient Education  2020 Elsevier Inc.      Edwina Barth, MD [facility]

## 2020-08-07 NOTE — Patient Instructions (Addendum)
   If you have lab work done today you will be contacted with your lab results within the next 2 weeks.  If you have not heard from us then please contact us. The fastest way to get your results is to register for My Chart.   IF you received an x-ray today, you will receive an invoice from [facility]. Please contact [facility] at [phone] with questions or concerns regarding your invoice.   IF you received labwork today, you will receive an invoice from [facility]. Please contact [facility] at [phone] with questions or concerns regarding your invoice.   Our billing staff will not be able to assist you with questions regarding bills from these companies.  You will be contacted with the lab results as soon as they are available. The fastest way to get your results is to activate your My Chart account. Instructions are located on the last page of this paperwork. If you have not heard from us regarding the results in 2 weeks, please contact this office.       Mantenimiento de la salud en los hombres Health Maintenance, Male Adoptar un estilo de vida saludable y recibir atencin preventiva son importantes para promover la salud y el bienestar. Consulte al mdico sobre:  El esquema adecuado para hacerse pruebas y exmenes peridicos.  Cosas que puede hacer por su cuenta para prevenir enfermedades y mantenerse sano. Qu debo saber sobre la dieta, el peso y el ejercicio? Consuma una dieta saludable   Consuma una dieta que incluya muchas verduras, frutas, productos lcteos con bajo contenido de grasa y protenas magras.  No consuma muchos alimentos ricos en grasas slidas, azcares agregados o sodio. Mantenga un peso saludable El ndice de masa muscular (IMC) es una medida que puede utilizarse para identificar posibles problemas de peso. Proporciona una estimacin de la grasa corporal basndose en el peso y la altura. Su mdico puede ayudarle a determinar su IMC y  a lograr o mantener un peso saludable. Haga ejercicio con regularidad Haga ejercicio con regularidad. Esta es una de las prcticas ms importantes que puede hacer por su salud. La mayora de los adultos deben seguir estas pautas:  Realizar, al menos, 150minutos de actividad fsica por semana. El ejercicio debe aumentar la frecuencia cardaca y hacerlo transpirar (ejercicio de intensidad moderada).  Hacer ejercicios de fortalecimiento por lo menos dos veces por semana. Agregue esto a su plan de ejercicio de intensidad moderada.  Pasar menos tiempo sentados. Incluso la actividad fsica ligera puede ser beneficiosa. Controle sus niveles de colesterol y lpidos en la sangre Comience a realizarse anlisis de lpidos y colesterol en la sangre a los 20aos y luego reptalos cada 5aos. Es posible que necesite controlar los niveles de colesterol con mayor frecuencia si:  Sus niveles de lpidos y colesterol son altos.  Es mayor de 40aos.  Presenta un alto riesgo de padecer enfermedades cardacas. Qu debo saber sobre las pruebas de deteccin del cncer? Muchos tipos de cncer pueden detectarse de manera temprana y, a menudo, pueden prevenirse. Segn su historia clnica y sus antecedentes familiares, es posible que deba realizarse pruebas de deteccin del cncer en diferentes edades. Esto puede incluir pruebas de deteccin de lo siguiente:  Cncer colorrectal.  Cncer de prstata.  Cncer de piel.  Cncer de pulmn. Qu debo saber sobre la enfermedad cardaca, la diabetes y la hipertensin arterial? Presin arterial y enfermedad cardaca  La hipertensin arterial causa enfermedades cardacas y aumenta el riesgo de accidente cerebrovascular. Es ms   probable que esto se manifieste en las personas que tienen lecturas de presin arterial alta, tienen ascendencia africana o tienen sobrepeso.  Hable con el mdico sobre sus valores de presin arterial deseados.  Hgase controlar la presin  arterial: ? Cada 3 a 5 aos si tiene entre 18 y 39 aos. ? Todos los aos si es mayor de 40aos.  Si tiene entre 65 y 75 aos y es fumador o sola fumar, pregntele al mdico si debe realizarse una prueba de deteccin de aneurisma artico abdominal (AAA) por nica vez. Diabetes Realcese exmenes de deteccin de la diabetes con regularidad. Este anlisis revisa el nivel de azcar en la sangre en ayunas. Hgase las pruebas de deteccin:  Cada tresaos despus de los 45aos de edad si tiene un peso normal y un bajo riesgo de padecer diabetes.  Con ms frecuencia y a partir de una edad inferior si tiene sobrepeso o un alto riesgo de padecer diabetes. Qu debo saber sobre la prevencin de infecciones? Hepatitis B Si tiene un riesgo ms alto de contraer hepatitis B, debe someterse a un examen de deteccin de este virus. Hable con el mdico para averiguar si tiene riesgo de contraer la infeccin por hepatitis B. Hepatitis C Se recomienda un anlisis de sangre para:  Todos los que nacieron entre 1945 y 1965.  Todas las personas que tengan un riesgo de haber contrado hepatitis C. Enfermedades de transmisin sexual (ETS)  Debe realizarse pruebas de deteccin de ITS todos los aos, incluidas la gonorrea y la clamidia, si: ? Es sexualmente activo y es menor de 24aos. ? Es mayor de 24aos, y el mdico le informa que corre riesgo de tener este tipo de infecciones. ? La actividad sexual ha cambiado desde que le hicieron la ltima prueba de deteccin y tiene un riesgo mayor de tener clamidia o gonorrea. Pregntele al mdico si usted tiene riesgo.  Pregntele al mdico si usted tiene un alto riesgo de contraer VIH. El mdico tambin puede recomendarle un medicamento recetado para ayudar a evitar la infeccin por el VIH. Si elige tomar medicamentos para prevenir el VIH, primero debe hacerse los anlisis de deteccin del VIH. Luego debe hacerse anlisis cada 3meses mientras est tomando los  medicamentos. Siga estas instrucciones en su casa: Estilo de vida  No consuma ningn producto que contenga nicotina o tabaco, como cigarrillos, cigarrillos electrnicos y tabaco de mascar. Si necesita ayuda para dejar de fumar, consulte al mdico.  No consuma drogas.  No comparta agujas.  Solicite ayuda a su mdico si necesita apoyo o informacin para abandonar las drogas. Consumo de alcohol  No beba alcohol si el mdico se lo prohbe.  Si bebe alcohol: ? Limite la cantidad que consume de 0 a 2 medidas por da. ? Est atento a la cantidad de alcohol que hay en las bebidas que toma. En los [place], una medida equivale a una botella de cerveza de 12oz (355ml), un vaso de vino de 5oz (148ml) o un vaso de una bebida alcohlica de alta graduacin de 1oz (44ml). Instrucciones generales  Realcese los estudios de rutina de la salud, dentales y de la vista.  Mantngase al da con las vacunas.  Infrmele a su mdico si: ? Se siente deprimido con frecuencia. ? Alguna vez ha sido vctima de maltrato o no se siente seguro en su casa. Resumen  Adoptar un estilo de vida saludable y recibir atencin preventiva son importantes para promover la salud y el bienestar.  Siga las instrucciones del mdico   acerca de una dieta saludable, el ejercicio y la realizacin de pruebas o exmenes para detectar enfermedades.  Siga las instrucciones del mdico con respecto al control del colesterol y la presin arterial. Esta informacin no tiene como fin reemplazar el consejo del mdico. Asegrese de hacerle al mdico cualquier pregunta que tenga. Document Revised: 11/04/2018 Document Reviewed: 11/04/2018 Elsevier Patient Education  2020 Elsevier Inc.  

## 2020-08-08 LAB — HEPATITIS C ANTIBODY: Hep C Virus Ab: <0.1 s/co ratio (ref 0.0–0.9)

## 2020-08-08 LAB — COMPREHENSIVE METABOLIC PANEL
ALT: 23 IU/L (ref 0–44)
AST: 22 IU/L (ref 0–40)
Albumin/Globulin Ratio: 1.8 (ref 1.2–2.2)
Albumin: 4.6 g/dL (ref 3.8–4.9)
Alkaline Phosphatase: 81 IU/L (ref 44–121)
BUN/Creatinine Ratio: 13 (ref 9–20)
BUN: 13 mg/dL (ref 6–24)
Bilirubin Total: 0.3 mg/dL (ref 0.0–1.2)
CO2: 25 mmol/L (ref 20–29)
Calcium: 9.4 mg/dL (ref 8.7–10.2)
Chloride: 101 mmol/L (ref 96–106)
Creatinine, Ser: 0.98 mg/dL (ref 0.76–1.27)
GFR calc Af Amer: 103 mL/min/{1.73_m2} (ref >59)
GFR calc non Af Amer: 89 mL/min/{1.73_m2} (ref >59)
Globulin, Total: 2.6 g/dL (ref 1.5–4.5)
Glucose: 58 mg/dL — ABNORMAL LOW (ref 65–99)
Potassium: 4.3 mmol/L (ref 3.5–5.2)
Sodium: 138 mmol/L (ref 134–144)
Total Protein: 7.2 g/dL (ref 6.0–8.5)

## 2020-08-08 LAB — LIPID PANEL
Chol/HDL Ratio: 4.3 ratio (ref 0.0–5.0)
Cholesterol, Total: 202 mg/dL — ABNORMAL HIGH (ref 100–199)
HDL: 47 mg/dL (ref >39)
LDL Chol Calc (NIH): 120 mg/dL — ABNORMAL HIGH (ref 0–99)
Triglycerides: 199 mg/dL — ABNORMAL HIGH (ref 0–149)
VLDL Cholesterol Cal: 35 mg/dL (ref 5–40)

## 2020-08-08 LAB — CBC WITH DIFFERENTIAL/PLATELET
Basophils Absolute: 0.1 10*3/uL (ref 0.0–0.2)
Basos: 1 %
EOS (ABSOLUTE): 0.2 10*3/uL (ref 0.0–0.4)
Eos: 5 %
Hematocrit: 43.4 % (ref 37.5–51.0)
Hemoglobin: 15 g/dL (ref 13.0–17.7)
Immature Grans (Abs): 0 10*3/uL (ref 0.0–0.1)
Immature Granulocytes: 0 %
Lymphocytes Absolute: 1.5 10*3/uL (ref 0.7–3.1)
Lymphs: 30 %
MCH: 29.4 pg (ref 26.6–33.0)
MCHC: 34.6 g/dL (ref 31.5–35.7)
MCV: 85 fL (ref 79–97)
Monocytes Absolute: 0.4 10*3/uL (ref 0.1–0.9)
Monocytes: 7 %
Neutrophils Absolute: 2.9 10*3/uL (ref 1.4–7.0)
Neutrophils: 57 %
Platelets: 193 10*3/uL (ref 150–450)
RBC: 5.1 x10E6/uL (ref 4.14–5.80)
RDW: 13.4 % (ref 11.6–15.4)
WBC: 5.1 10*3/uL (ref 3.4–10.8)

## 2020-08-08 LAB — LIPASE: Lipase: 24 U/L (ref 13–78)

## 2020-08-08 LAB — SAR COV2 SEROLOGY (COVID19)AB(IGG),IA: DiaSorin SARS-CoV-2 Ab, IgG: POSITIVE

## 2022-02-13 ENCOUNTER — Ambulatory Visit (INDEPENDENT_AMBULATORY_CARE_PROVIDER_SITE_OTHER): Payer: 59 | Admitting: Emergency Medicine

## 2022-02-13 ENCOUNTER — Encounter: Payer: Self-pay | Admitting: Emergency Medicine

## 2022-02-13 VITALS — BP 100/80 | HR 79 | Temp 98.1°F | Ht 69.0 in | Wt 187.2 lb

## 2022-02-13 DIAGNOSIS — B351 Tinea unguium: Secondary | ICD-10-CM

## 2022-02-13 DIAGNOSIS — Z125 Encounter for screening for malignant neoplasm of prostate: Secondary | ICD-10-CM

## 2022-02-13 DIAGNOSIS — Z13228 Encounter for screening for other metabolic disorders: Secondary | ICD-10-CM

## 2022-02-13 DIAGNOSIS — Z130 Encounter for screening for diseases of the blood and blood-forming organs and certain disorders involving the immune mechanism: Secondary | ICD-10-CM

## 2022-02-13 DIAGNOSIS — Z0001 Encounter for general adult medical examination with abnormal findings: Secondary | ICD-10-CM | POA: Diagnosis not present

## 2022-02-13 DIAGNOSIS — Z13220 Encounter for screening for lipoid disorders: Secondary | ICD-10-CM | POA: Diagnosis not present

## 2022-02-13 DIAGNOSIS — Z1329 Encounter for screening for other suspected endocrine disorder: Secondary | ICD-10-CM

## 2022-02-13 LAB — CBC WITH DIFFERENTIAL/PLATELET
Basophils Absolute: 0 10*3/uL (ref 0.0–0.1)
Basophils Relative: 0.6 % (ref 0.0–3.0)
Eosinophils Absolute: 0.2 10*3/uL (ref 0.0–0.7)
Eosinophils Relative: 3.4 % (ref 0.0–5.0)
HCT: 44.1 % (ref 39.0–52.0)
Hemoglobin: 15.1 g/dL (ref 13.0–17.0)
Lymphocytes Relative: 30.2 % (ref 12.0–46.0)
Lymphs Abs: 1.5 10*3/uL (ref 0.7–4.0)
MCHC: 34.2 g/dL (ref 30.0–36.0)
MCV: 83.7 fl (ref 78.0–100.0)
Monocytes Absolute: 0.3 10*3/uL (ref 0.1–1.0)
Monocytes Relative: 6.4 % (ref 3.0–12.0)
Neutro Abs: 3 10*3/uL (ref 1.4–7.7)
Neutrophils Relative %: 59.4 % (ref 43.0–77.0)
Platelets: 241 10*3/uL (ref 150.0–400.0)
RBC: 5.28 Mil/uL (ref 4.22–5.81)
RDW: 13.1 % (ref 11.5–15.5)
WBC: 5 10*3/uL (ref 4.0–10.5)

## 2022-02-13 LAB — COMPREHENSIVE METABOLIC PANEL
ALT: 25 U/L (ref 0–53)
AST: 35 U/L (ref 0–37)
Albumin: 4.2 g/dL (ref 3.5–5.2)
Alkaline Phosphatase: 85 U/L (ref 39–117)
BUN: 19 mg/dL (ref 6–23)
CO2: 26 mEq/L (ref 19–32)
Calcium: 9 mg/dL (ref 8.4–10.5)
Chloride: 104 mEq/L (ref 96–112)
Creatinine, Ser: 1.12 mg/dL (ref 0.40–1.50)
GFR: 75.23 mL/min (ref >60.00)
Glucose, Bld: 87 mg/dL (ref 70–99)
Potassium: 4.3 mEq/L (ref 3.5–5.1)
Sodium: 139 mEq/L (ref 135–145)
Total Bilirubin: 0.4 mg/dL (ref 0.2–1.2)
Total Protein: 6.3 g/dL (ref 6.0–8.3)

## 2022-02-13 LAB — LIPID PANEL
Cholesterol: 203 mg/dL — ABNORMAL HIGH (ref 0–200)
HDL: 52.1 mg/dL (ref >39.00)
NonHDL: 151.06
Total CHOL/HDL Ratio: 4
Triglycerides: 248 mg/dL — ABNORMAL HIGH (ref 0.0–149.0)
VLDL: 49.6 mg/dL — ABNORMAL HIGH (ref 0.0–40.0)

## 2022-02-13 LAB — LDL CHOLESTEROL, DIRECT: Direct LDL: 115 mg/dL

## 2022-02-13 LAB — HEMOGLOBIN A1C: Hgb A1c MFr Bld: 5.5 % (ref 4.6–6.5)

## 2022-02-13 LAB — PSA: PSA: 1.42 ng/mL (ref 0.10–4.00)

## 2022-02-13 MED ORDER — TERBINAFINE HCL 250 MG PO TABS: 250.0000 mg | ORAL_TABLET | Freq: Every day | ORAL | 1 refills | Status: AC

## 2022-02-13 NOTE — Progress Notes (Signed)
Nathaniel Davidson ?51 y.o. ? ? ?Chief Complaint  ?Patient presents with  ? Follow-up  ? ? ?HISTORY OF PRESENT ILLNESS: ?This is a 51 y.o. male here for annual exam. ?History of dyslipidemia. ?No other chronic medical problems. ?Complaining of fungal infection of fingernails. ?Also requesting testosterone blood levels. ?No other complaints or medical concerns today. ? ?HPI ? ? ?Prior to Admission medications   ?Medication Sig Start Date End Date Taking? Authorizing Provider  ?rosuvastatin (CRESTOR) 10 MG tablet Take 1 tablet (10 mg total) by mouth daily. 08/07/20  Yes Gwendoline Judy, Ines Bloomer, MD  ?pantoprazole (PROTONIX) 40 MG tablet TAKE 1 TABLET BY MOUTH DAILY BEFORE BREAFFAST ?NEED APPT FOR FURTHER REFILLS ?Patient not taking: Reported on 08/07/2020 09/07/18   Ladene Artist, MD  ? ? ?No Known Allergies ? ?Patient Active Problem List  ? Diagnosis Date Noted  ? Dyslipidemia 06/08/2018  ? ? ?History reviewed. No pertinent past medical history. ? ?History reviewed. No pertinent surgical history. ? ?Social History  ? ?Socioeconomic History  ? Marital status: Married  ?  Spouse name: Not on file  ? Number of children: Not on file  ? Years of education: Not on file  ? Highest education level: Not on file  ?Occupational History  ? Not on file  ?Tobacco Use  ? Smoking status: Never  ? Smokeless tobacco: Never  ?Substance and Sexual Activity  ? Alcohol use: Yes  ?  Alcohol/week: 1.0 - 3.0 standard drink  ?  Types: 1 - 3 Cans of beer per week  ? Drug use: No  ? Sexual activity: Yes  ?Other Topics Concern  ? Not on file  ?Social History Narrative  ? Not on file  ? ?Social Determinants of Health  ? ?Financial Resource Strain: Not on file  ?Food Insecurity: Not on file  ?Transportation Needs: Not on file  ?Physical Activity: Not on file  ?Stress: Not on file  ?Social Connections: Not on file  ?Intimate Partner Violence: Not on file  ? ? ?Family History  ?Problem Relation Age of Onset  ? Cancer Father 55  ?     prostate   ? ? ? ?Review of Systems  ?Constitutional: Negative.  Negative for chills and fever.  ?HENT: Negative.  Negative for congestion and sore throat.   ?Eyes:   ?     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  Negative for cough and shortness of breath.   ?Cardiovascular: Negative.  Negative for chest pain and palpitations.  ?Gastrointestinal: Negative.  Negative for abdominal pain, diarrhea, nausea and vomiting.  ?Genitourinary: Negative.  Negative for dysuria and hematuria.  ?Musculoskeletal: Negative.   ?Skin: Negative.  Negative for rash.  ?Neurological: Negative.  Negative for dizziness and headaches.  ?All other systems reviewed and are negative. ? ?Today's Vitals  ? 02/13/22 1417  ?BP: 100/80  ?Pulse: 79  ?Temp: 98.1 ?F (36.7 ?C)  ?TempSrc: Oral  ?SpO2: 96%  ?Weight: 187 lb 4 oz (84.9 kg)  ?Height: 5\' 9"  (1.753 m)  ? ?Body mass index is 27.65 kg/m?. ? ?Physical Exam ?Vitals reviewed.  ?Constitutional:   ?   Appearance: Normal appearance.  ?HENT:  ?   Head: Normocephalic.  ?   Right Ear: Tympanic membrane, ear canal and external ear normal.  ?   Left Ear: Tympanic membrane, ear canal and external ear normal.  ?   Mouth/Throat:  ?   Mouth: Mucous membranes are moist.  ?   Pharynx: Oropharynx is clear.  ?Eyes:  ?  Extraocular Movements: Extraocular movements intact.  ?   Conjunctiva/sclera: Conjunctivae normal.  ?   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   Rate and Rhythm: Normal rate and regular rhythm.  ?   Pulses: Normal pulses.  ?   Heart sounds: Normal heart sounds.  ?Pulmonary:  ?   Effort: Pulmonary effort is normal.  ?   Breath sounds: Normal breath sounds.  ?Abdominal:  ?   General: There is no distension.  ?   Palpations: Abdomen is soft. There is no mass.  ?   Tenderness: There is no abdominal tenderness.  ?Musculoskeletal:     ?   General: Normal range of motion.  ?   Cervical back: No tenderness.  ?   Right lower leg: No edema.  ?   Left lower leg: No  edema.  ?Lymphadenopathy:  ?   Cervical: No cervical adenopathy.  ?Skin: ?   General: Skin is warm and dry.  ?   Capillary Refill: Capillary refill takes less than 2 seconds.  ?   Comments: Onychomycosis of several fingernails  ?Neurological:  ?   General: No focal deficit present.  ?   Mental Status: He is alert and oriented to person, place, and time.  ?Psychiatric:     ?   Mood and Affect: Mood normal.     ?   Behavior: Behavior normal.  ? ? ? ?ASSESSMENT & PLAN: ?Problem List Items Addressed This Visit   ?None ?Visit Diagnoses   ? ? Encounter for general adult medical examination with abnormal findings    -  Primary  ? Onychomycosis of nail of digit of hand      ? Relevant Medications  ? terbinafine (LAMISIL) 250 MG tablet  ? Prostate cancer screening      ? Relevant Orders  ? PSA(Must document that pt has been informed of limitations of PSA testing.)  ? Screening for deficiency anemia      ? Relevant Orders  ? CBC with Differential  ? Screening for lipoid disorders      ? Relevant Orders  ? Lipid panel  ? Screening for endocrine, metabolic and immunity disorder      ? Relevant Orders  ? Comprehensive metabolic panel  ? Hemoglobin A1c  ? TestT+TestF+SHBG  ? ?  ? ?Patient Instructions  ?Mantenimiento de [employer] hombres ?Health Maintenance, Male ?Adoptar un estilo de vida saludable y recibir atenci?n preventiva son importantes para promover la salud y Musician. Consulte al m?dico sobre: ?El esquema adecuado para [place] pruebas y ex?menes peri?dicos. ?Cosas que puede hacer por su cuenta para prevenir enfermedades y [place] sano. ??Qu? debo saber sobre la dieta, el peso y el ejercicio? ?Consuma una dieta saludable ? ?Consuma una dieta que incluya muchas verduras, frutas, productos l?cteos con bajo contenido de grasa y prote?nas magras. ?No consuma muchos alimentos ricos en grasas s?lidas, az?cares agregados o sodio. ?Mantenga un peso saludable ?El ?ndice de masa muscular [facility]) es una medida que puede  utilizarse para identificar posibles problemas de [place]. Proporciona una estimaci?n de la grasa corporal bas?ndose en el peso y la altura. Su m?dico puede ayudarle a Radiation protection practitioner [place] y a Scientist, forensic o Theatre manager un peso saludable. ?Haga ejercicio con regularidad ?Haga ejercicio con regularidad. Esta es una de las pr?cticas m?s importantes que puede hacer por su salud. La mayor?a de los adultos deben seguir estas pautas: ?Realizar, al menos, 150 minutos de [place] f?sica por semana.  El ejercicio debe aumentar la frecuencia card?aca y Nature conservation officer transpirar (ejercicio de intensidad moderada). ?Hacer ejercicios de fortalecimiento por lo [employer] por semana. Agregue esto a su plan de ejercicio de intensidad moderada. ?Pase menos tiempo sentado. Incluso la actividad f?sica ligera puede ser beneficiosa. ?Controle sus niveles de colesterol y l?pidos en la sangre ?Comience a realizarse an?lisis de l?pidos y Research officer, trade union en la sangre a los 20 a?os y luego rep?talos cada 5 a?os. ?Es posible que Automotive engineer los niveles de colesterol con mayor frecuencia si: ?Sus niveles de l?pidos y colesterol son altos. ?Es mayor de 40 a?os. ?Presenta un alto riesgo de padecer enfermedades card?acas. ??Qu? debo saber sobre las pruebas de detecci?n del c?ncer? ?Muchos tipos de c?ncer pueden detectarse de manera temprana y, a menudo, pueden prevenirse. Seg?n su historia cl?nica y sus antecedentes familiares, es posible que deba realizarse pruebas de detecci?n del c?ncer en diferentes edades. Esto puede incluir pruebas de detecci?n de lo siguiente: ?C?ncer colorrectal. ?C?ncer de pr?stata. ?C?ncer de piel. ?C?ncer de pulm?n. ??Qu? debo saber sobre la enfermedad card?aca, la diabetes y la hipertensi?n arterial? ?Presi?n arterial y enfermedad card?aca ?La hipertensi?n arterial causa enfermedades card?acas y [place] el riesgo de accidente cerebrovascular. Es m?s probable que Armed forces training and education officer en las personas que tienen lecturas de presi?n  arterial alta o tienen sobrepeso. ?Hable con el m?dico sobre sus valores de presi?n arterial deseados. ?H?gase controlar la presi?n arterial: ?Cada 3 a 5 a?os si tiene entre 18 y 34 a?os. ?Todos los a?os si es

## 2022-02-13 NOTE — Patient Instructions (Signed)
Mantenimiento de la salud en los hombres Health Maintenance, Male Adoptar un estilo de vida saludable y recibir atencin preventiva son importantes para promover la salud y el bienestar. Consulte al mdico sobre: El esquema adecuado para hacerse pruebas y exmenes peridicos. Cosas que puede hacer por su cuenta para prevenir enfermedades y mantenerse sano. Qu debo saber sobre la dieta, el peso y el ejercicio? Consuma una dieta saludable  Consuma una dieta que incluya muchas verduras, frutas, productos lcteos con bajo contenido de grasa y protenas magras. No consuma muchos alimentos ricos en grasas slidas, azcares agregados o sodio. Mantenga un peso saludable El ndice de masa muscular (IMC) es una medida que puede utilizarse para identificar posibles problemas de peso. Proporciona una estimacin de la grasa corporal basndose en el peso y la altura. Su mdico puede ayudarle a determinar su IMC y a lograr o mantener un peso saludable. Haga ejercicio con regularidad Haga ejercicio con regularidad. Esta es una de las prcticas ms importantes que puede hacer por su salud. La mayora de los adultos deben seguir estas pautas: Realizar, al menos, 150 minutos de actividad fsica por semana. El ejercicio debe aumentar la frecuencia cardaca y hacerlo transpirar (ejercicio de intensidad moderada). Hacer ejercicios de fortalecimiento por lo menos dos veces por semana. Agregue esto a su plan de ejercicio de intensidad moderada. Pase menos tiempo sentado. Incluso la actividad fsica ligera puede ser beneficiosa. Controle sus niveles de colesterol y lpidos en la sangre Comience a realizarse anlisis de lpidos y colesterol en la sangre a los 20 aos y luego reptalos cada 5 aos. Es posible que necesite controlar los niveles de colesterol con mayor frecuencia si: Sus niveles de lpidos y colesterol son altos. Es mayor de 40 aos. Presenta un alto riesgo de padecer enfermedades cardacas. Qu debo  saber sobre las pruebas de deteccin del cncer? Muchos tipos de cncer pueden detectarse de manera temprana y, a menudo, pueden prevenirse. Segn su historia clnica y sus antecedentes familiares, es posible que deba realizarse pruebas de deteccin del cncer en diferentes edades. Esto puede incluir pruebas de deteccin de lo siguiente: Cncer colorrectal. Cncer de prstata. Cncer de piel. Cncer de pulmn. Qu debo saber sobre la enfermedad cardaca, la diabetes y la hipertensin arterial? Presin arterial y enfermedad cardaca La hipertensin arterial causa enfermedades cardacas y aumenta el riesgo de accidente cerebrovascular. Es ms probable que esto se manifieste en las personas que tienen lecturas de presin arterial alta o tienen sobrepeso. Hable con el mdico sobre sus valores de presin arterial deseados. Hgase controlar la presin arterial: Cada 3 a 5 aos si tiene entre 18 y 39 aos. Todos los aos si es mayor de 40 aos. Si tiene entre 65 y 75 aos y es fumador o sola fumar, pregntele al mdico si debe realizarse una prueba de deteccin de aneurisma artico abdominal (AAA) por nica vez. Diabetes Realcese exmenes de deteccin de la diabetes con regularidad. Este anlisis revisa el nivel de azcar en la sangre en ayunas. Hgase las pruebas de deteccin: Cada tres aos despus de los 45 aos de edad si tiene un peso normal y un bajo riesgo de padecer diabetes. Con ms frecuencia y a partir de una edad inferior si tiene sobrepeso o un alto riesgo de padecer diabetes. Qu debo saber sobre la prevencin de infecciones? Hepatitis B Si tiene un riesgo ms alto de contraer hepatitis B, debe someterse a un examen de deteccin de este virus. Hable con el mdico para averiguar si tiene riesgo de   contraer la infeccin por hepatitis B. Hepatitis C Se recomienda un anlisis de sangre para: Todos los que nacieron entre 1945 y 1965. Todas las personas que tengan un riesgo de haber  contrado hepatitis C. Enfermedades de transmisin sexual (ETS) Debe realizarse pruebas de deteccin de ITS todos los aos, incluidas la gonorrea y la clamidia, si: Es sexualmente activo y es menor de 24 aos. Es mayor de 24 aos, y el mdico le informa que corre riesgo de tener este tipo de infecciones. La actividad sexual ha cambiado desde que le hicieron la ltima prueba de deteccin y tiene un riesgo mayor de tener clamidia o gonorrea. Pregntele al mdico si usted tiene riesgo. Pregntele al mdico si usted tiene un alto riesgo de contraer VIH. El mdico tambin puede recomendarle un medicamento recetado para ayudar a evitar la infeccin por el VIH. Si elige tomar medicamentos para prevenir el VIH, primero debe hacerse los anlisis de deteccin del VIH. Luego debe hacerse anlisis cada 3 meses mientras est tomando los medicamentos. Siga estas indicaciones en su casa: Consumo de alcohol No beba alcohol si el mdico se lo prohbe. Si bebe alcohol: Limite la cantidad que consume de 0 a 2 bebidas por da. Sepa cunta cantidad de alcohol hay en las bebidas que toma. En los [place], una medida equivale a una botella de cerveza de 12 oz (355 ml), un vaso de vino de 5 oz (148 ml) o un vaso de una bebida alcohlica de alta graduacin de 1 oz (44 ml). Estilo de vida No consuma ningn producto que contenga nicotina o tabaco. Estos productos incluyen cigarrillos, tabaco para mascar y aparatos de vapeo, como los cigarrillos electrnicos. Si necesita ayuda para dejar de consumir estos productos, consulte al mdico. No consuma drogas. No comparta agujas. Solicite ayuda a su mdico si necesita apoyo o informacin para abandonar las drogas. Indicaciones generales Realcese los estudios de rutina de la salud, dentales y de la vista. Mantngase al da con las vacunas. Infrmele a su mdico si: Se siente deprimido con frecuencia. Alguna vez ha sido vctima de maltrato o no se siente seguro en su  casa. Resumen Adoptar un estilo de vida saludable y recibir atencin preventiva son importantes para promover la salud y el bienestar. Siga las instrucciones del mdico acerca de una dieta saludable, el ejercicio y la realizacin de pruebas o exmenes para detectar enfermedades. Siga las instrucciones del mdico con respecto al control del colesterol y la presin arterial. Esta informacin no tiene como fin reemplazar el consejo del mdico. Asegrese de hacerle al mdico cualquier pregunta que tenga. Document Revised: 03/21/2021 Document Reviewed: 03/21/2021 Elsevier Patient Education  2023 Elsevier Inc.  

## 2022-02-15 ENCOUNTER — Telehealth: Payer: Self-pay

## 2022-02-15 NOTE — Telephone Encounter (Signed)
I called the [facility] and updated the Rx for 14 day supply.  ? ?FYI ?

## 2022-02-15 NOTE — Telephone Encounter (Signed)
Should be for 14 days.  Thanks.

## 2022-02-15 NOTE — Telephone Encounter (Signed)
Pharmacy calling to verify Rx for terbinafine (LAMISIL) 250 MG tablet. The Rx states  ?Dispense Quantity: 14 tablet Refills: 1   ?     ?Sig: Take 1 tablet (250 mg total) by mouth daily for 28 days.  ?     ? ?Please verify the quantity. ? ?Please advise  ?

## 2022-02-22 LAB — TESTT+TESTF+SHBG: Sex Hormone Binding: 35.4 nmol/L (ref 19.3–76.4)

## 2022-10-14 ENCOUNTER — Encounter: Payer: Self-pay | Admitting: Emergency Medicine

## 2022-10-14 ENCOUNTER — Ambulatory Visit (INDEPENDENT_AMBULATORY_CARE_PROVIDER_SITE_OTHER): Payer: 59 | Admitting: Emergency Medicine

## 2022-10-14 VITALS — BP 120/74 | HR 80 | Temp 98.2°F | Ht 69.0 in | Wt 186.4 lb

## 2022-10-14 DIAGNOSIS — Z23 Encounter for immunization: Secondary | ICD-10-CM | POA: Diagnosis not present

## 2022-10-14 DIAGNOSIS — R1011 Right upper quadrant pain: Secondary | ICD-10-CM

## 2022-10-14 DIAGNOSIS — G8929 Other chronic pain: Secondary | ICD-10-CM | POA: Insufficient documentation

## 2022-10-14 LAB — CBC WITH DIFFERENTIAL/PLATELET
Basophils Absolute: 0 10*3/uL (ref 0.0–0.1)
Basophils Relative: 0.5 % (ref 0.0–3.0)
Eosinophils Absolute: 0.2 10*3/uL (ref 0.0–0.7)
Eosinophils Relative: 3.3 % (ref 0.0–5.0)
HCT: 46.2 % (ref 39.0–52.0)
Hemoglobin: 16.3 g/dL (ref 13.0–17.0)
Lymphocytes Relative: 25.9 % (ref 12.0–46.0)
Lymphs Abs: 1.5 10*3/uL (ref 0.7–4.0)
MCHC: 35.2 g/dL (ref 30.0–36.0)
MCV: 84 fl (ref 78.0–100.0)
Monocytes Absolute: 0.3 10*3/uL (ref 0.1–1.0)
Monocytes Relative: 5.2 % (ref 3.0–12.0)
Neutro Abs: 3.9 10*3/uL (ref 1.4–7.7)
Neutrophils Relative %: 65.1 % (ref 43.0–77.0)
Platelets: 244 10*3/uL (ref 150.0–400.0)
RBC: 5.5 Mil/uL (ref 4.22–5.81)
RDW: 12.9 % (ref 11.5–15.5)
WBC: 6 10*3/uL (ref 4.0–10.5)

## 2022-10-14 LAB — COMPREHENSIVE METABOLIC PANEL
ALT: 17 U/L (ref 0–53)
AST: 18 U/L (ref 0–37)
Albumin: 4.3 g/dL (ref 3.5–5.2)
Alkaline Phosphatase: 81 U/L (ref 39–117)
BUN: 19 mg/dL (ref 6–23)
CO2: 29 mEq/L (ref 19–32)
Calcium: 9.3 mg/dL (ref 8.4–10.5)
Chloride: 104 mEq/L (ref 96–112)
Creatinine, Ser: 1.35 mg/dL (ref 0.40–1.50)
GFR: 59.84 mL/min — ABNORMAL LOW (ref >60.00)
Glucose, Bld: 67 mg/dL — ABNORMAL LOW (ref 70–99)
Potassium: 4.1 mEq/L (ref 3.5–5.1)
Sodium: 140 mEq/L (ref 135–145)
Total Bilirubin: 0.4 mg/dL (ref 0.2–1.2)
Total Protein: 6.6 g/dL (ref 6.0–8.3)

## 2022-10-14 LAB — URINALYSIS
Bilirubin Urine: NEGATIVE
Hgb urine dipstick: NEGATIVE
Leukocytes,Ua: NEGATIVE
Nitrite: NEGATIVE
Specific Gravity, Urine: 1.015 (ref 1.000–1.030)
Total Protein, Urine: NEGATIVE
Urine Glucose: NEGATIVE
Urobilinogen, UA: 0.2 (ref 0.0–1.0)
pH: 7.5 (ref 5.0–8.0)

## 2022-10-14 LAB — LIPASE: Lipase: 16 U/L (ref 11.0–59.0)

## 2022-10-14 NOTE — Patient Instructions (Signed)
Dolor abdominal en los adultos Abdominal Pain, Adult El dolor de estmago (abdominal) puede tener muchas causas. La mayora de las veces, el dolor de estmago no es peligroso. Muchos de estos casos de dolor de estmago pueden controlarse y tratarse en casa. Sin embargo, a veces, el dolor de estmago es grave. El mdico intentar descubrir la causa del dolor de estmago. Siga estas instrucciones en su casa:  Medicamentos Tome los medicamentos de venta libre y los recetados solamente como se lo haya indicado el mdico. No tome medicamentos que lo ayuden a defecar (laxantes), salvo que el mdico se lo indique. Instrucciones generales Est atento al dolor de estmago para detectar cualquier cambio. Beba suficiente lquido para mantener el pis (la orina) de color amarillo plido. Concurra a todas las visitas de seguimiento como se lo haya indicado el mdico. Esto es importante. Comunquese con un mdico si: El dolor de estmago cambia o empeora. No tiene apetito o baja de peso sin proponrselo. Tiene dificultades para defecar (est estreido) o heces lquidas (diarrea) durante ms de 2 o 3 das. Siente dolor al orinar o defecar. El dolor de estmago lo despierta de noche. El dolor empeora con las comidas, despus de comer o con determinados alimentos. Tiene vmitos y no puede retener nada de lo que ingiere. Tiene fiebre. Observa sangre en la orina. Solicite ayuda de inmediato si: El dolor no desaparece en el tiempo indicado por el mdico. No puede dejar de vomitar. Siente dolor solamente en zonas especficas del abdomen, como el lado derecho o la parte inferior izquierda. Tiene heces con sangre, de color negro o con aspecto alquitranado. Tiene dolor muy intenso en el vientre, clicos o meteorismo. Presenta signos de no tener suficientes lquidos o agua en el cuerpo (deshidratacin), por ejemplo: Orina oscura, muy escasa o falta de orina. Labios agrietados. Sequedad de boca. Ojos  hundidos. Somnolencia. Debilidad. Tiene dificultad para respirar o dolor en el pecho. Resumen Muchos de estos casos de dolor de estmago pueden controlarse y tratarse en casa. Est atento al dolor de estmago para detectar cualquier cambio. Tome los medicamentos de venta libre y los recetados solamente como se lo haya indicado el mdico. Comunquese con un mdico si el dolor de estmago cambia o empeora. Busque ayuda de inmediato si tiene dolor muy intenso en el vientre, clicos o meteorismo. Esta informacin no tiene como fin reemplazar el consejo del mdico. Asegrese de hacerle al mdico cualquier pregunta que tenga. Document Revised: 04/21/2019 Document Reviewed: 04/21/2019 Elsevier Patient Education  2023 Elsevier Inc.  

## 2022-10-14 NOTE — Progress Notes (Signed)
Nathaniel Davidson 51 y.o.   Chief Complaint  Patient presents with   Abdominal Pain    Rt sided abd pain, progressively getting worse the last few months     HISTORY OF PRESENT ILLNESS: This is a 51 y.o. male complaining of chronic intermittent pain to right upper abdomen for the past couple years. No other associated symptoms  Abdominal Pain Pertinent negatives include no dysuria, fever or headaches.     Prior to Admission medications   Medication Sig Start Date End Date Taking? Authorizing Provider  pantoprazole (PROTONIX) 40 MG tablet TAKE 1 TABLET BY MOUTH DAILY BEFORE BREAFFAST NEED APPT FOR FURTHER REFILLS Patient not taking: Reported on 08/07/2020 09/07/18   Meryl Dare, MD  rosuvastatin (CRESTOR) 10 MG tablet Take 1 tablet (10 mg total) by mouth daily. Patient not taking: Reported on 10/14/2022 08/07/20   Georgina Quint, MD    No Known Allergies  Patient Active Problem List   Diagnosis Date Noted   Dyslipidemia 06/08/2018    No past medical history on file.  No past surgical history on file.  Social History   Socioeconomic History   Marital status: Married    Spouse name: Not on file   Number of children: Not on file   Years of education: Not on file   Highest education level: Not on file  Occupational History   Not on file  Tobacco Use   Smoking status: Never   Smokeless tobacco: Never  Substance and Sexual Activity   Alcohol use: Yes    Alcohol/week: 1.0 - 3.0 standard drink of alcohol    Types: 1 - 3 Cans of beer per week   Drug use: No   Sexual activity: Yes  Other Topics Concern   Not on file  Social History Narrative   Not on file   Social Determinants of Health   Financial Resource Strain: Not on file  Food Insecurity: Not on file  Transportation Needs: Not on file  Physical Activity: Not on file  Stress: Not on file  Social Connections: Not on file  Intimate Partner Violence: Not on file    Family History   Problem Relation Age of Onset   Cancer Father 92       prostate     Review of Systems  Constitutional: Negative.  Negative for chills and fever.  HENT: Negative.  Negative for congestion and sore throat.   Respiratory: Negative.  Negative for cough and shortness of breath.   Cardiovascular: Negative.  Negative for chest pain and palpitations.  Gastrointestinal:  Positive for abdominal pain.  Genitourinary: Negative.  Negative for dysuria and urgency.  Skin: Negative.  Negative for rash.  Neurological: Negative.  Negative for dizziness and headaches.  All other systems reviewed and are negative.  Today's Vitals   10/14/22 1337  BP: 120/74  Pulse: 80  Temp: 98.2 F (36.8 C)  TempSrc: Oral  SpO2: 92%  Weight: 186 lb 6 oz (84.5 kg)  Height: 5\' 9"  (1.753 m)   Body mass index is 27.52 kg/m.   Physical Exam Vitals reviewed.  Constitutional:      Appearance: He is well-developed.  HENT:     Head: Normocephalic.     Mouth/Throat:     Mouth: Mucous membranes are moist.     Pharynx: Oropharynx is clear.  Eyes:     Extraocular Movements: Extraocular movements intact.     Conjunctiva/sclera: Conjunctivae normal.     Pupils: Pupils are equal, round, and reactive  to light.  Cardiovascular:     Rate and Rhythm: Normal rate and regular rhythm.     Pulses: Normal pulses.     Heart sounds: Normal heart sounds.  Pulmonary:     Effort: Pulmonary effort is normal.     Breath sounds: Normal breath sounds.  Abdominal:     General: There is no distension.     Palpations: Abdomen is soft.     Tenderness: There is no abdominal tenderness.  Musculoskeletal:     Cervical back: No tenderness.  Lymphadenopathy:     Cervical: No cervical adenopathy.  Skin:    General: Skin is warm and dry.  Neurological:     General: No focal deficit present.     Mental Status: He is alert and oriented to person, place, and time.  Psychiatric:        Mood and Affect: Mood normal.        Behavior:  Behavior normal.      ASSESSMENT & PLAN: A total of 42 minutes was spent with the patient and counseling/coordination of care regarding preparing for this visit, review of most recent office visit notes, review of most recent imaging reports, review of most recent blood work results, differential diagnosis of abdominal pain and need for workup, education on nutrition, prognosis, documentation and need for follow-up.  Problem List Items Addressed This Visit       Other   Abdominal pain, chronic, right upper quadrant - Primary    Clinically stable.  No red flag signs or symptoms. CT scan abdomen and pelvis from 2018 within normal limits Upper endoscopy from 2018 showed gastritis. Seen by me in 2021 for same today.  Never scheduled ultrasound of gallbladder. Differential diagnosis discussed with patient. Needs workup.  Blood work done today. Right upper quadrant abdominal ultrasound requested. States he had normal colonoscopy about 3 years ago. May need new referral to GI at this time.      Relevant Orders   Flu Vaccine QUAD 6+ mos PF IM (Fluarix Quad PF) (Completed)   US Abdomen Limited RUQ (LIVER/GB)   CBC with Differential/Platelet (Completed)   Comprehensive metabolic panel (Completed)   Lipase (Completed)   Urinalysis (Completed)   Patient Instructions  Dolor abdominal en los adultos Abdominal Pain, Adult El dolor de estmago (abdominal) puede tener muchas causas. La mayora de las veces, el dolor de [place] no es peligroso. Muchos de [employer] de dolor de estmago pueden controlarse y tratarse en casa. Sin embargo, a Occupational psychologist, Chief Technology Officer de [place] es grave. El mdico intentar descubrir la causa del dolor de [place]. Siga estas instrucciones en su casa:  Medicamentos [employer] de venta libre y los recetados solamente como se lo haya indicado el mdico. No tome medicamentos que lo ayuden a Advertising copywriter (laxantes), salvo que el mdico se lo indique. Instrucciones  generales Est atento al dolor de estmago para Insurance risk surveyor cambio. Beba suficiente lquido para Radio producer pis (la orina) de color amarillo plido. Concurra a todas las visitas de [address] se lo haya indicado el mdico. Esto es importante. Comunquese con un mdico si: El dolor de [address] cambia o [place]. No tiene apetito o baja de peso sin proponrselo. Tiene dificultades para defecar (est estreido) o heces lquidas (diarrea) durante ms de 2 o 3 das. Siente dolor al orinar o defecar. El dolor de estmago lo despierta de noche. El dolor empeora con las comidas, despus de comer o con determinados alimentos. Tiene vmitos y no puede  retener nada de lo que ingiere. Tiene fiebre. Observa sangre en la orina. Solicite ayuda de inmediato si: El dolor no desaparece en el tiempo indicado por el mdico. No puede dejar de vomitar. Siente dolor solamente en zonas especficas del abdomen, como el lado derecho o la parte inferior izquierda. Tiene heces con sangre, de color negro o con aspecto alquitranado. Tiene dolor muy intenso en el vientre, clicos o meteorismo. Presenta signos de no tener suficientes lquidos o agua en el cuerpo (deshidratacin), por ejemplo: [place], muy escasa o falta de orina. Labios agrietados. Sequedad de boca. Ojos hundidos. Somnolencia. Debilidad. Tiene dificultad para respirar o Journalist, newspaper. Resumen Muchos de [employer] de dolor de estmago pueden controlarse y tratarse en casa. Est atento al dolor de estmago para Insurance risk surveyor cambio. Tome los medicamentos de venta libre y los recetados solamente como se lo haya indicado el mdico. Comunquese con un mdico si el dolor de estmago cambia o [place]. Busque ayuda de inmediato si tiene dolor muy intenso en el vientre, clicos o meteorismo. Esta informacin no tiene Theme park manager el consejo del mdico. Asegrese de hacerle al mdico cualquier pregunta que tenga. Document  Revised: 04/21/2019 Document Reviewed: 04/21/2019 Elsevier Patient Education  2023 Elsevier Inc.    Edwina Barth, MD [facility]

## 2022-10-14 NOTE — Assessment & Plan Note (Signed)
Clinically stable.  No red flag signs or symptoms. CT scan abdomen and pelvis from 2018 within normal limits Upper endoscopy from 2018 showed gastritis. Seen by me in 2021 for same today.  Never scheduled ultrasound of gallbladder. Differential diagnosis discussed with patient. Needs workup.  Blood work done today. Right upper quadrant abdominal ultrasound requested. States he had normal colonoscopy about 3 years ago. May need new referral to GI at this time.

## 2022-11-08 ENCOUNTER — Ambulatory Visit
Admission: RE | Admit: 2022-11-08 | Discharge: 2022-11-08 | Disposition: A | Discharge status: Home / Self Care | Payer: 59 | Source: Ambulatory Visit | Attending: Emergency Medicine | Admitting: Emergency Medicine

## 2022-11-08 DIAGNOSIS — G8929 Other chronic pain: Secondary | ICD-10-CM

## 2022-11-12 ENCOUNTER — Other Ambulatory Visit: Payer: Self-pay | Admitting: *Deleted

## 2022-11-12 DIAGNOSIS — G8929 Other chronic pain: Secondary | ICD-10-CM

## 2022-11-12 NOTE — Progress Notes (Signed)
Refer to GI please.

## 2022-11-12 NOTE — Progress Notes (Signed)
Thanks

## 2022-11-27 ENCOUNTER — Encounter: Payer: Self-pay | Admitting: Physician Assistant

## 2022-12-23 ENCOUNTER — Encounter: Payer: Self-pay | Admitting: *Deleted

## 2022-12-27 ENCOUNTER — Ambulatory Visit (INDEPENDENT_AMBULATORY_CARE_PROVIDER_SITE_OTHER): Payer: 59 | Admitting: Physician Assistant

## 2022-12-27 ENCOUNTER — Encounter: Payer: Self-pay | Admitting: Physician Assistant

## 2022-12-27 VITALS — BP 128/76 | HR 80 | Ht 69.0 in | Wt 180.0 lb

## 2022-12-27 DIAGNOSIS — R1011 Right upper quadrant pain: Secondary | ICD-10-CM

## 2022-12-27 NOTE — Patient Instructions (Addendum)
You have been scheduled for a HIDA scan at [facility] (1st floor) on Friday, 01/10/23. Please arrive 30 minutes prior to your scheduled appointment at  [id number] am. Make certain not to have anything to eat or drink at least 6 hours prior to your test. Should this appointment date or time not work well for you, please call radiology scheduling at [phone].  _____________________________________________________________________ hepatobiliary (HIDA) scan is an imaging procedure used to diagnose problems in the liver, gallbladder and bile ducts. In the HIDA scan, a radioactive chemical or tracer is injected into a vein in your arm. The tracer is handled by the liver like bile. Bile is a fluid produced and excreted by your liver that helps your digestive system break down fats in the foods you eat. Bile is stored in your gallbladder and the gallbladder releases the bile when you eat a meal. A special nuclear medicine scanner (gamma camera) tracks the flow of the tracer from your liver into your gallbladder and small intestine.  During your HIDA scan  You'll be asked to change into a hospital gown before your HIDA scan begins. Your health care team will position you on a table, usually on your back. The radioactive tracer is then injected into a vein in your arm.The tracer travels through your bloodstream to your liver, where it's taken up by the bile-producing cells. The radioactive tracer travels with the bile from your liver into your gallbladder and through your bile ducts to your small intestine.You may feel some pressure while the radioactive tracer is injected into your vein. As you lie on the table, a special gamma camera is positioned over your abdomen taking pictures of the tracer as it moves through your body. The gamma camera takes pictures continually for about an hour. You'll need to keep still during the HIDA scan. This can become uncomfortable, but you may find that you can lessen the  discomfort by taking deep breaths and thinking about other things. Tell your health care team if you're uncomfortable. The radiologist will watch on a computer the progress of the radioactive tracer through your body. The HIDA scan may be stopped when the radioactive tracer is seen in the gallbladder and enters your small intestine. This typically takes about an hour. In some cases extra imaging will be performed if original images aren't satisfactory, if morphine is given to help visualize the gallbladder or if the medication CCK is given to look at the contraction of the gallbladder. This test typically takes 2 hours to complete. _______________________________________________________________ _______________________________________________________  If your blood pressure at your visit was 140/90 or greater, please contact your primary care physician to follow up on this.  _______________________________________________________  If you are age 54 or older, your body mass index should be between 23-30. Your Body mass index is 26.58 kg/m. If this is out of the aforementioned range listed, please consider follow up with your Primary Care Provider.  If you are age 54 or younger, your body mass index should be between 19-25. Your Body mass index is 26.58 kg/m. If this is out of the aformentioned range listed, please consider follow up with your Primary Care Provider.   ________________________________________________________  The [facility] providers would like to encourage you to use [facility] to communicate with providers for non-urgent requests or questions.  Due to long hold times on the telephone, sending your provider a message by [facility] may be a faster and more efficient way to get a response.  Please allow 48  business hours for a response.  Please remember that this is for non-urgent requests.  _______________________________________________________ Due to recent changes in healthcare laws, you  may see the results of your imaging and laboratory studies on MyChart before your provider has had a chance to review them.  We understand that in some cases there may be results that are confusing or concerning to you. Not all laboratory results come back in the same time frame and the provider may be waiting for multiple results in order to interpret others.  Please give [place] 48 hours in order for your provider to thoroughly review all the results before contacting the office for clarification of your results.  _____________________________________________  Clois Comber le ha programado una exploracin HIDA en [facility] (primer piso) el viernes 15/03/24. Llegue 30 minutos antes de su cita programada a las 7:30 am. Asegrese de no comer ni beber nada al menos 6 horas antes de la prueba. Si la fecha u hora de esta cita no le conviene, llame a programacin de radiologa al [phone]. _______________________________________________________________________ Pecola Leisure exploracin hepatobiliar (HIDA) es un procedimiento de imgenes que se utiliza para Human resources officer en el hgado, la vescula biliar y los conductos biliares. En la exploracin HIDA, se inyecta una sustancia qumica radiactiva o un marcador en una vena del brazo. El hgado maneja el marcador como si fuera la bilis. La bilis es un lquido producido y excretado por el hgado que ayuda al sistema digestivo a descomponer las grasas de los alimentos que consume. La bilis se almacena en la vescula biliar y la vescula biliar libera la bilis cuando usted ingiere una comida. Un escner especial de medicina nuclear (cmara gamma) rastrea el flujo del marcador desde el hgado hasta la vescula biliar y el intestino delgado.  Durante su escaneo HIDA Se le pedir que se ponga una bata de hospital antes de que comience la exploracin HIDA. Su equipo de atencin mdica lo [employer] mesa, generalmente [place]. Luego, el marcador radiactivo se  inyecta en una vena del brazo. El marcador viaja a travs del torrente sanguneo hasta el hgado, donde es absorbido por las clulas productoras de bilis. El marcador radioactivo viaja con la bilis desde el hgado hasta la vescula biliar y a travs de los conductos biliares hasta el intestino delgado. Es posible que sienta algo de presin mientras se Architect radioactivo en la vena. Mientras usted se acuesta en la mesa, se coloca una cmara gamma especial sobre su abdomen para tomar fotografas del trazador a medida que se mueve por su cuerpo. La cmara gamma toma fotografas continuamente durante aproximadamente una hora. Deber permanecer quieto durante el escaneo HIDA. Esto puede resultar incmodo, pero es posible que descubra que puede disminuir el malestar respirando profundamente y Secretary/administrator en otras cosas. Informe a su equipo de atencin mdica si se siente incmodo. El radilogo observar en una computadora el progreso del marcador radiactivo a travs de su cuerpo. La exploracin HIDA se puede detener cuando se ve el marcador radiactivo en la vescula biliar y entra al intestino delgado. Esto suele tardar aproximadamente Leone Brand. En algunos casos, se realizarn imgenes adicionales si las imgenes originales no son satisfactorias, si se administra morfina para ayudar a visualizar la vescula biliar o si se administra el medicamento CCK para observar la contraccin de la vescula biliar. Esta prueba suele tardar 2 horas en completarse. _______________________________________________________________ _______________________________________________________  Lavada Mesi presin arterial en su visita era 140/90 o ms, comunquese con su mdico de atencin  primaria para Patent attorney.  ____________________  Debido a cambios recientes en las leyes de atencin mdica, es posible que vea los Joes de sus estudios de imgenes y de Designer, fashion/clothing en MyChart antes de que su proveedor haya tenido  la oportunidad de revisarlos. Entendemos que en algunos casos puede haber resultados que sean confusos o preocupantes para usted. No todos los resultados de laboratorio llegan en el mismo perodo de tiempo y es posible que el proveedor est esperando varios resultados para Primary school teacher. Dnos 65 horas para que su proveedor revise minuciosamente todos los resultados antes de comunicarse con la oficina para Audiological scientist.

## 2022-12-27 NOTE — Progress Notes (Signed)
Chief Complaint: Chronic right upper quadrant pain  HPI:    Nathaniel Davidson is a 52 year old Hispanic speaking male with a past medical history as listed below, known to Dr. Fuller Plan previously who then followed with Dr. Collene Mares it looks like, who was referred to me by Horald Pollen, * for a complaint of chronic right upper quadrant pain.    08/28/2017 EGD with mild chronic gastritis.    09/25/2021 visit with Dr. Collene Mares likely for colonoscopy.  Patient reports 1 that was normal a few years ago.    10/14/2022 CBC, CMP and lipase normal.    11/08/2022 right upper quadrant ultrasound normal.  Patient was noted to have pain over the gallbladder though.    Today, the patient is seen with the help of interpreter, he tells me he has had right upper quadrant pain after meals and if he strains too much for at least the past 8 years or so.  Tells me that 2 weeks ago it was so bad that he thought about going to the ED but then the pain went away and over the past 2 weeks it has been a little bit better.  Tells me it feels like someone is poking him really hard up underneath his ribs.  Since he was having such severe pain he talked to his sister and his mother both had their gallbladders out they told him to change his diet.  He stopped eating fried food and avocados and has cut back on alcohol on the weekends and he feels like things are some better.  Denies any associated symptoms of nausea or vomiting or indigestion.    Denies fever, chills, weight loss or symptoms that awaken him from sleep.  Past Medical History:  Diagnosis Date   Dyslipidemia    Gastritis    GERD (gastroesophageal reflux disease)     History reviewed. No pertinent surgical history.  Current Outpatient Medications  Medication Sig Dispense Refill   pantoprazole (PROTONIX) 40 MG tablet TAKE 1 TABLET BY MOUTH DAILY BEFORE BREAFFAST NEED APPT FOR FURTHER REFILLS (Patient not taking: Reported on 08/07/2020) 30 tablet 0    rosuvastatin (CRESTOR) 10 MG tablet Take 1 tablet (10 mg total) by mouth daily. (Patient not taking: Reported on 10/14/2022) 90 tablet 3   No current facility-administered medications for this visit.    Allergies as of 12/27/2022   (No Known Allergies)    Family History  Problem Relation Age of Onset   Prostate cancer Father 67    Social History   Socioeconomic History   Marital status: Married    Spouse name: Not on file   Number of children: Not on file   Years of education: Not on file   Highest education level: Not on file  Occupational History   Not on file  Tobacco Use   Smoking status: Never   Smokeless tobacco: Never  Substance and Sexual Activity   Alcohol use: Yes    Alcohol/week: 1.0 - 3.0 standard drink of alcohol    Types: 1 - 3 Cans of beer per week   Drug use: No   Sexual activity: Yes  Other Topics Concern   Not on file  Social History Narrative   Not on file   Social Determinants of Health   Financial Resource Strain: Not on file  Food Insecurity: Not on file  Transportation Needs: Not on file  Physical Activity: Not on file  Stress: Not on file  Social Connections: Not on file  Intimate Partner Violence: Not on file    Review of Systems:    Constitutional: No weight loss, fever or chills Skin: No rash  Cardiovascular: No chest pain Respiratory: No SOB  Gastrointestinal: See HPI and otherwise negative Genitourinary: No dysuria Neurological: No headache, dizziness or syncope Musculoskeletal: No new muscle or joint pain Hematologic: No bleeding  Psychiatric: No history of depression or anxiety   Physical Exam:  Vital signs: BP 128/76   Pulse 80   Ht '5\' 9"'$  (1.753 m)   Wt 180 lb (81.6 kg)   BMI 26.58 kg/m   Constitutional:   Pleasant Hispanic male appears to be in NAD, Well developed, Well nourished, alert and cooperative Head:  Normocephalic and atraumatic. Eyes:   PEERL, EOMI. No icterus. Conjunctiva pink. Ears:  Normal auditory  acuity. Neck:  Supple Throat: Oral cavity and pharynx without inflammation, swelling or lesion.  Respiratory: Respirations even and unlabored. Lungs clear to auscultation bilaterally.   No wheezes, crackles, or rhonchi.  Cardiovascular: Normal S1, S2. No MRG. Regular rate and rhythm. No peripheral edema, cyanosis or pallor.  Gastrointestinal:  Soft, nondistended, mild RUQ ttp under rib cage, No rebound or guarding. Normal bowel sounds. No appreciable masses or hepatomegaly. Rectal:  Not performed.  Msk:  Symmetrical without gross deformities. Without edema, no deformity or joint abnormality.  Neurologic:  Alert and  oriented x4;  grossly normal neurologically.  Skin:   Dry and intact without significant lesions or rashes. Psychiatric: . Demonstrates good judgement and reason without abnormal affect or behaviors.  RELEVANT LABS AND IMAGING: CBC    Component Value Date/Time   WBC 6.0 10/14/2022 1411   RBC 5.50 10/14/2022 1411   HGB 16.3 10/14/2022 1411   HGB 15.0 08/07/2020 1612   HCT 46.2 10/14/2022 1411   HCT 43.4 08/07/2020 1612   PLT 244.0 10/14/2022 1411   PLT 193 08/07/2020 1612   MCV 84.0 10/14/2022 1411   MCV 85 08/07/2020 1612   MCH 29.4 08/07/2020 1612   MCHC 35.2 10/14/2022 1411   RDW 12.9 10/14/2022 1411   RDW 13.4 08/07/2020 1612   LYMPHSABS 1.5 10/14/2022 1411   LYMPHSABS 1.5 08/07/2020 1612   MONOABS 0.3 10/14/2022 1411   EOSABS 0.2 10/14/2022 1411   EOSABS 0.2 08/07/2020 1612   BASOSABS 0.0 10/14/2022 1411   BASOSABS 0.1 08/07/2020 1612    CMP     Component Value Date/Time   NA 140 10/14/2022 1411   NA 138 08/07/2020 1612   K 4.1 10/14/2022 1411   CL 104 10/14/2022 1411   CO2 29 10/14/2022 1411   GLUCOSE 67 (L) 10/14/2022 1411   BUN 19 10/14/2022 1411   BUN 13 08/07/2020 1612   CREATININE 1.35 10/14/2022 1411   CALCIUM 9.3 10/14/2022 1411   PROT 6.6 10/14/2022 1411   PROT 7.2 08/07/2020 1612   ALBUMIN 4.3 10/14/2022 1411   ALBUMIN 4.6 08/07/2020  1612   AST 18 10/14/2022 1411   ALT 17 10/14/2022 1411   ALKPHOS 81 10/14/2022 1411   BILITOT 0.4 10/14/2022 1411   BILITOT 0.3 08/07/2020 1612   GFRNONAA 89 08/07/2020 1612   GFRAA 103 08/07/2020 1612    Assessment: 1.  Chronic right upper quadrant pain: EGD in 2018 with chronic gastritis and otherwise normal, labs in December normal, ultrasound recently normal, symptoms sound very consistent with a gallbladder etiology though; consider biliary dyskinesia versus other  Plan: 1.  Scheduled patient for HIDA scan with [facility]. 2.  Patient will continue his current  diet of decreased fatty foods 3.  Requesting colonoscopy report from Dr. Collene Mares. 4.  Patient to follow in clinic per recommendations after imaging above.  Chart sent to Dr. Lyndel Safe today as patient is technically unassigned after being seen by Dr. Collene Mares.  Ellouise Newer, PA-C [facility] 12/27/2022, 2:37 PM  Cc: Horald Pollen, *   Addendum: 01/02/2023  3:47 PM  Received records from Dr. Lorie Apley office.  Patient seen 03/02/2019 for colon cancer screening.  That time described a maternal grandmother died of colon cancer in her late 43s.  Patient had an EGD done 08/2017 which revealed mild chronic gastritis but no evidence of H. pylori.  03/19/2019 colonoscopy with a 7 mm sessile polyp found in the mid descending colon, small sessile polyp found in the distal sigmoid colon and otherwise normal.  Biopsies revealed an adenoma from the descending colon and benign lymphoid aggregate in the sigmoid.  Repeat recommended in 7 years.  Patient will be placed in recall for colonoscopy 2027.  Ellouise Newer, PA-C

## 2023-01-04 NOTE — Progress Notes (Signed)
Agree with assessment/plan. If neg HIDA with EF, CT AP with contrast Followed by repeat EGD/colon Await colonoscopy report from Dr. Collene Mares for final decision Needs follow-up in 6-8 weeks  Carmell Austria, MD [facility] [phone]

## 2023-01-10 ENCOUNTER — Ambulatory Visit (HOSPITAL_COMMUNITY)
Admission: RE | Admit: 2023-01-10 | Discharge: 2023-01-10 | Disposition: A | Discharge status: Home / Self Care | Payer: 59 | Source: Ambulatory Visit | Attending: Physician Assistant | Admitting: Physician Assistant

## 2023-01-10 DIAGNOSIS — R1011 Right upper quadrant pain: Secondary | ICD-10-CM | POA: Insufficient documentation

## 2023-01-10 MED ORDER — TECHNETIUM TC 99M MEBROFENIN IV KIT
4.9500 | PACK | Freq: Once | INTRAVENOUS | Status: AC
  Administered 2023-01-10: 4.95 via INTRAVENOUS

## 2023-01-13 ENCOUNTER — Telehealth: Payer: Self-pay | Admitting: Physician Assistant

## 2023-01-13 NOTE — Telephone Encounter (Signed)
Inbound call from patient, returning call in regards to HIDA scan.

## 2023-01-14 NOTE — Telephone Encounter (Signed)
See the results notes

## 2023-02-12 ENCOUNTER — Ambulatory Visit: Payer: 59 | Admitting: Physician Assistant

## 2023-04-11 ENCOUNTER — Encounter: Payer: Self-pay | Admitting: Physician Assistant

## 2023-04-11 ENCOUNTER — Ambulatory Visit (INDEPENDENT_AMBULATORY_CARE_PROVIDER_SITE_OTHER): Payer: 59 | Admitting: Physician Assistant

## 2023-04-11 VITALS — BP 126/78 | HR 78 | Ht 66.0 in | Wt 184.8 lb

## 2023-04-11 DIAGNOSIS — G8929 Other chronic pain: Secondary | ICD-10-CM

## 2023-04-11 DIAGNOSIS — R1011 Right upper quadrant pain: Secondary | ICD-10-CM

## 2023-04-11 NOTE — Patient Instructions (Signed)
You have been scheduled for an endoscopy. Please follow written instructions given to you at your visit today. If you use inhalers (even only as needed), please bring them with you on the day of your procedure.   _______________________________________________________  If your blood pressure at your visit was 140/90 or greater, please contact your primary care physician to follow up on this.  _______________________________________________________  If you are age 108 or older, your body mass index should be between 23-30. Your Body mass index is 29.83 kg/m. If this is out of the aforementioned range listed, please consider follow up with your Primary Care Provider.  If you are age 38 or younger, your body mass index should be between 19-25. Your Body mass index is 29.83 kg/m. If this is out of the aformentioned range listed, please consider follow up with your Primary Care Provider.   ________________________________________________________  The [facility] providers would like to encourage you to use [facility] to communicate with providers for non-urgent requests or questions.  Due to long hold times on the telephone, sending your provider a message by [facility] may be a faster and more efficient way to get a response.  Please allow 48 business hours for a response.  Please remember that this is for non-urgent requests.  _______________________________________________________

## 2023-04-11 NOTE — Progress Notes (Signed)
Chief Complaint: Follow-up right upper quadrant pain  HPI:    Nathaniel Davidson is a 52 year old Hispanic speaking male with a past medical history as listed below, known to Dr. Russella Dar previously, who then followed with Dr. Loreta Ave and now Dr. Chales Abrahams after last office visit, who returns clinic today for follow-up of his chronic right upper quadrant pain.    08/28/2017 EGD with mild chronic gastritis.    5/22//2020 colonoscopy with Dr. Loreta Ave with a 7 mm sessile polyp in the mid descending colon, small sessile polyp in the distal sigmoid colon otherwise normal.  Biopsy showed adenoma and repeat recommended in 7 years.    10/14/2022 CBC, CMP and lipase normal.    11/08/2022 right upper quadrant ultrasound normal.  Patient was noted to have pain over the gallbladder though.    12/27/2022 patient described right upper quadrant pain after meals and if he strained too much for 8 years.  Feels like someone is poking him hard up underneath the ribs.  At that time sent for HIDA with [facility].    01/10/2023 HIDA scan was normal.  At that time recommended repeat EGD.  He wanted to be seen again prior to having EGD.    Today, patient returns to clinic accompanied by an interpreter.  Nothing is changed with his pain.  Reminds me has been going on 8 years but it did go away for a little while and then came back and is worse over the past 2 years, still worse after meals and also if he strains too much.  It feels like someone is poking/pinching him up under his ribs on the right side.  This does not inhibit his daily activities but does bother him.  Also some weight loss recently.    Denies fever, chills, nausea or vomiting.     Past Medical History:  Diagnosis Date   Dyslipidemia    Gastritis    GERD (gastroesophageal reflux disease)     No past surgical history on file.  Current Outpatient Medications  Medication Sig Dispense Refill   pantoprazole (PROTONIX) 40 MG tablet TAKE 1 TABLET BY MOUTH DAILY BEFORE  BREAFFAST NEED APPT FOR FURTHER REFILLS (Patient not taking: Reported on 08/07/2020) 30 tablet 0   rosuvastatin (CRESTOR) 10 MG tablet Take 1 tablet (10 mg total) by mouth daily. (Patient not taking: Reported on 10/14/2022) 90 tablet 3   No current facility-administered medications for this visit.    Allergies as of 04/11/2023   (No Known Allergies)    Family History  Problem Relation Age of Onset   Prostate cancer Father 7    Social History   Socioeconomic History   Marital status: Married    Spouse name: Not on file   Number of children: Not on file   Years of education: Not on file   Highest education level: Not on file  Occupational History   Not on file  Tobacco Use   Smoking status: Never   Smokeless tobacco: Never  Substance and Sexual Activity   Alcohol use: Yes    Alcohol/week: 1.0 - 3.0 standard drink of alcohol    Types: 1 - 3 Cans of beer per week   Drug use: No   Sexual activity: Yes  Other Topics Concern   Not on file  Social History Narrative   Not on file   Social Determinants of Health   Financial Resource Strain: Not on file  Food Insecurity: Not on file  Transportation Needs: Not on file  Physical Activity: Not on file  Stress: Not on file  Social Connections: Not on file  Intimate Partner Violence: Not on file    Review of Systems:    Constitutional: No weight loss, fever or chills Cardiovascular: No chest pain Respiratory: No SOB  Gastrointestinal: See HPI and otherwise negative   Physical Exam:  Vital signs: BP 126/78   Pulse 78   Ht 5\' 6"  (1.676 m)   Wt 184 lb 12.8 oz (83.8 kg)   SpO2 96%   BMI 29.83 kg/m    Constitutional:   Pleasant Hispanic male appears to be in NAD, Well developed, Well nourished, alert and cooperative Respiratory: Respirations even and unlabored. Lungs clear to auscultation bilaterally.   No wheezes, crackles, or rhonchi.  Cardiovascular: Normal S1, S2. No MRG. Regular rate and rhythm. No peripheral  edema, cyanosis or pallor.  Gastrointestinal:  Soft, nondistended, mild RUQ ttp, No rebound or guarding. Normal bowel sounds. No appreciable masses or hepatomegaly. Rectal:  Not performed.  Psychiatric: Demonstrates good judgement and reason without abnormal affect or behaviors.  RELEVANT LABS AND IMAGING: CBC    Component Value Date/Time   WBC 6.0 10/14/2022 1411   RBC 5.50 10/14/2022 1411   HGB 16.3 10/14/2022 1411   HGB 15.0 08/07/2020 1612   HCT 46.2 10/14/2022 1411   HCT 43.4 08/07/2020 1612   PLT 244.0 10/14/2022 1411   PLT 193 08/07/2020 1612   MCV 84.0 10/14/2022 1411   MCV 85 08/07/2020 1612   MCH 29.4 08/07/2020 1612   MCHC 35.2 10/14/2022 1411   RDW 12.9 10/14/2022 1411   RDW 13.4 08/07/2020 1612   LYMPHSABS 1.5 10/14/2022 1411   LYMPHSABS 1.5 08/07/2020 1612   MONOABS 0.3 10/14/2022 1411   EOSABS 0.2 10/14/2022 1411   EOSABS 0.2 08/07/2020 1612   BASOSABS 0.0 10/14/2022 1411   BASOSABS 0.1 08/07/2020 1612    CMP     Component Value Date/Time   NA 140 10/14/2022 1411   NA 138 08/07/2020 1612   K 4.1 10/14/2022 1411   CL 104 10/14/2022 1411   CO2 29 10/14/2022 1411   GLUCOSE 67 (L) 10/14/2022 1411   BUN 19 10/14/2022 1411   BUN 13 08/07/2020 1612   CREATININE 1.35 10/14/2022 1411   CALCIUM 9.3 10/14/2022 1411   PROT 6.6 10/14/2022 1411   PROT 7.2 08/07/2020 1612   ALBUMIN 4.3 10/14/2022 1411   ALBUMIN 4.6 08/07/2020 1612   AST 18 10/14/2022 1411   ALT 17 10/14/2022 1411   ALKPHOS 81 10/14/2022 1411   BILITOT 0.4 10/14/2022 1411   BILITOT 0.3 08/07/2020 1612   GFRNONAA 89 08/07/2020 1612   GFRAA 103 08/07/2020 1612    Assessment: 1.  Chronic right upper quadrant pain: For the past 8 years, but worse over the past 2, workup as in HPI, recent ultrasound and HIDA scan negative, last EGD in 2018, pain does worsen with eating sometimes; consider duodenitis versus musculoskeletal  Plan: 1.  Scheduled patient for diagnostic EGD in the [facility] with Dr.  Chales Abrahams.  Did provide the patient a detailed list of risks for the procedure and he agrees to proceed. Patient is appropriate for endoscopic procedure(s) in the ambulatory ([facility]) setting.  2.  Answered all the patient's questions today.  If EGD is negative would recommend follow-up with Dr. Chales Abrahams himself for further recommendations if he has any or a referral to DO Terrilee Files in sports medicine to consider musculoskeletal etiology/alternative diagnosis.  Did discuss this today. 3.  Patient to follow in clinic per recommendations after time of procedure.  Hyacinth Meeker, PA-C [facility] 04/11/2023, 2:50 PM  Cc: Georgina Quint, *

## 2023-04-23 NOTE — Progress Notes (Signed)
Agree with assessment/plan.  Raj Jadwiga Faidley, MD [facility] [phone]  

## 2023-05-18 ENCOUNTER — Encounter: Payer: Self-pay | Admitting: Certified Registered Nurse Anesthetist

## 2023-05-19 ENCOUNTER — Ambulatory Visit (AMBULATORY_SURGERY_CENTER): Payer: 59 | Admitting: Gastroenterology

## 2023-05-19 ENCOUNTER — Encounter: Payer: Self-pay | Admitting: Gastroenterology

## 2023-05-19 VITALS — BP 112/76 | HR 61 | Temp 97.8°F | Resp 10 | Ht 66.0 in | Wt 184.0 lb

## 2023-05-19 DIAGNOSIS — R1011 Right upper quadrant pain: Secondary | ICD-10-CM | POA: Diagnosis not present

## 2023-05-19 DIAGNOSIS — K219 Gastro-esophageal reflux disease without esophagitis: Secondary | ICD-10-CM

## 2023-05-19 DIAGNOSIS — G8929 Other chronic pain: Secondary | ICD-10-CM

## 2023-05-19 DIAGNOSIS — K2950 Unspecified chronic gastritis without bleeding: Secondary | ICD-10-CM

## 2023-05-19 MED ORDER — SODIUM CHLORIDE 0.9 % IV SOLN: 500.0000 mL | Freq: Once | INTRAVENOUS | Status: DC

## 2023-05-19 NOTE — Patient Instructions (Signed)
YOU HAD AN ENDOSCOPIC PROCEDURE TODAY: Refer to the procedure report and other information in the discharge instructions given to you for any specific questions about what was found during the examination. If this information does not answer your questions, please call [place] office at [phone] to clarify.   YOU SHOULD EXPECT: Some feelings of bloating in the abdomen. Passage of more gas than usual. Walking can help get rid of the air that was put into your GI tract during the procedure and reduce the bloating. If you had a lower endoscopy (such as a colonoscopy or flexible sigmoidoscopy) you may notice spotting of blood in your stool or on the toilet paper. Some abdominal soreness may be present for a day or two, also.  DIET: Your first meal following the procedure should be a light meal and then it is ok to progress to your normal diet. A half-sandwich or bowl of soup is an example of a good first meal. Heavy or fried foods are harder to digest and may make you feel nauseous or bloated. Drink plenty of fluids but you should avoid alcoholic beverages for 24 hours. If you had a esophageal dilation, please see attached instructions for diet.    ACTIVITY: Your care partner should take you home directly after the procedure. You should plan to take it easy, moving slowly for the rest of the day. You can resume normal activity the day after the procedure however YOU SHOULD NOT DRIVE, use power tools, machinery or perform tasks that involve climbing or major physical exertion for 24 hours (because of the sedation medicines used during the test).   SYMPTOMS TO REPORT IMMEDIATELY: A gastroenterologist can be reached at any hour. Please call [phone]  for any of the following symptoms:   Following upper endoscopy (EGD, EUS, ERCP, esophageal dilation) Vomiting of blood or coffee ground material  New, significant abdominal pain  New, significant chest pain or pain under the shoulder blades  Painful or  persistently difficult swallowing  New shortness of breath  Black, tarry-looking or red, bloody stools  FOLLOW UP:  If any biopsies were taken you will be contacted by phone or by letter within the next 1-3 weeks. Call [phone]  if you have not heard about the biopsies in 3 weeks.  Please also call with any specific questions about appointments or follow up tests.  

## 2023-05-19 NOTE — Progress Notes (Signed)
VS by DT  Pt's states no medical or surgical changes since previsit or office visit.   Interpreter used today at the [facility] for this pt.  Interpreter's name is- Gerilyn Pilgrim

## 2023-05-19 NOTE — Progress Notes (Signed)
Called to room to assist during endoscopic procedure.  Patient ID and intended procedure confirmed with present staff. Received instructions for my participation in the procedure from the performing physician.  

## 2023-05-19 NOTE — Progress Notes (Signed)
8270 Robinul 0.1 mg IV given due large amount of secretions upon assessment.  MD made aware, vss

## 2023-05-19 NOTE — Progress Notes (Signed)
Chief Complaint: Follow-up right upper quadrant pain   HPI:    Nathaniel Davidson is a 52 year old Hispanic speaking male with a past medical history as listed below, known to Dr. Russella Dar previously, who then followed with Dr. Loreta Ave and now Dr. Chales Abrahams after last office visit, who returns clinic today for follow-up of his chronic right upper quadrant pain.    08/28/2017 EGD with mild chronic gastritis.    5/22//2020 colonoscopy with Dr. Loreta Ave with a 7 mm sessile polyp in the mid descending colon, small sessile polyp in the distal sigmoid colon otherwise normal.  Biopsy showed adenoma and repeat recommended in 7 years.    10/14/2022 CBC, CMP and lipase normal.    11/08/2022 right upper quadrant ultrasound normal.  Patient was noted to have pain over the gallbladder though.    12/27/2022 patient described right upper quadrant pain after meals and if he strained too much for 8 years.  Feels like someone is poking him hard up underneath the ribs.  At that time sent for HIDA with [facility].    01/10/2023 HIDA scan was normal.  At that time recommended repeat EGD.  He wanted to be seen again prior to having EGD.    Today, patient returns to clinic accompanied by an interpreter.  Nothing is changed with his pain.  Reminds me has been going on 8 years but it did go away for a little while and then came back and is worse over the past 2 years, still worse after meals and also if he strains too much.  It feels like someone is poking/pinching him up under his ribs on the right side.  This does not inhibit his daily activities but does bother him.  Also some weight loss recently.    Denies fever, chills, nausea or vomiting.          Past Medical History:  Diagnosis Date   Dyslipidemia     Gastritis     GERD (gastroesophageal reflux disease)            No past surgical history on file.             Current Outpatient Medications  Medication Sig Dispense Refill   pantoprazole (PROTONIX) 40 MG tablet TAKE 1 TABLET  BY MOUTH DAILY BEFORE BREAFFAST NEED APPT FOR FURTHER REFILLS (Patient not taking: Reported on 08/07/2020) 30 tablet 0   rosuvastatin (CRESTOR) 10 MG tablet Take 1 tablet (10 mg total) by mouth daily. (Patient not taking: Reported on 10/14/2022) 90 tablet 3      No current facility-administered medications for this visit.           Allergies as of 04/11/2023   (No Known Allergies)           Family History  Problem Relation Age of Onset   Prostate cancer Father 22          Social History         Socioeconomic History   Marital status: Married      Spouse name: Not on file   Number of children: Not on file   Years of education: Not on file   Highest education level: Not on file  Occupational History   Not on file  Tobacco Use   Smoking status: Never   Smokeless tobacco: Never  Substance and Sexual Activity   Alcohol use: Yes      Alcohol/week: 1.0 - 3.0 standard drink of alcohol      Types: 1 -  3 Cans of beer per week   Drug use: No   Sexual activity: Yes  Other Topics Concern   Not on file  Social History Narrative   Not on file    Social Determinants of Health    Financial Resource Strain: Not on file  Food Insecurity: Not on file  Transportation Needs: Not on file  Physical Activity: Not on file  Stress: Not on file  Social Connections: Not on file  Intimate Partner Violence: Not on file      Review of Systems:    Constitutional: No weight loss, fever or chills Cardiovascular: No chest pain Respiratory: No SOB  Gastrointestinal: See HPI and otherwise negative    Physical Exam:  Vital signs: BP 126/78   Pulse 78   Ht 5\' 6"  (1.676 m)   Wt 184 lb 12.8 oz (83.8 kg)   SpO2 96%   BMI 29.83 kg/m     Constitutional:   Pleasant Hispanic male appears to be in NAD, Well developed, Well nourished, alert and cooperative Respiratory: Respirations even and unlabored. Lungs clear to auscultation bilaterally.   No wheezes, crackles, or rhonchi.   Cardiovascular: Normal S1, S2. No MRG. Regular rate and rhythm. No peripheral edema, cyanosis or pallor.  Gastrointestinal:  Soft, nondistended, mild RUQ ttp, No rebound or guarding. Normal bowel sounds. No appreciable masses or hepatomegaly. Rectal:  Not performed.  Psychiatric: Demonstrates good judgement and reason without abnormal affect or behaviors.   RELEVANT LABS AND IMAGING: CBC Labs (Brief)          Component Value Date/Time    WBC 6.0 10/14/2022 1411    RBC 5.50 10/14/2022 1411    HGB 16.3 10/14/2022 1411    HGB 15.0 08/07/2020 1612    HCT 46.2 10/14/2022 1411    HCT 43.4 08/07/2020 1612    PLT 244.0 10/14/2022 1411    PLT 193 08/07/2020 1612    MCV 84.0 10/14/2022 1411    MCV 85 08/07/2020 1612    MCH 29.4 08/07/2020 1612    MCHC 35.2 10/14/2022 1411    RDW 12.9 10/14/2022 1411    RDW 13.4 08/07/2020 1612    LYMPHSABS 1.5 10/14/2022 1411    LYMPHSABS 1.5 08/07/2020 1612    MONOABS 0.3 10/14/2022 1411    EOSABS 0.2 10/14/2022 1411    EOSABS 0.2 08/07/2020 1612    BASOSABS 0.0 10/14/2022 1411    BASOSABS 0.1 08/07/2020 1612        CMP     Labs (Brief)          Component Value Date/Time    NA 140 10/14/2022 1411    NA 138 08/07/2020 1612    K 4.1 10/14/2022 1411    CL 104 10/14/2022 1411    CO2 29 10/14/2022 1411    GLUCOSE 67 (L) 10/14/2022 1411    BUN 19 10/14/2022 1411    BUN 13 08/07/2020 1612    CREATININE 1.35 10/14/2022 1411    CALCIUM 9.3 10/14/2022 1411    PROT 6.6 10/14/2022 1411    PROT 7.2 08/07/2020 1612    ALBUMIN 4.3 10/14/2022 1411    ALBUMIN 4.6 08/07/2020 1612    AST 18 10/14/2022 1411    ALT 17 10/14/2022 1411    ALKPHOS 81 10/14/2022 1411    BILITOT 0.4 10/14/2022 1411    BILITOT 0.3 08/07/2020 1612    GFRNONAA 89 08/07/2020 1612    GFRAA 103 08/07/2020 1612        Assessment:  1.  Chronic right upper quadrant pain: For the past 8 years, but worse over the past 2, workup as in HPI, recent ultrasound and HIDA scan negative,  last EGD in 2018, pain does worsen with eating sometimes; consider duodenitis versus musculoskeletal   Plan: 1.  Scheduled patient for diagnostic EGD in the [facility] with Dr. Chales Abrahams.  Did provide the patient a detailed list of risks for the procedure and he agrees to proceed. Patient is appropriate for endoscopic procedure(s) in the ambulatory ([facility]) setting.  2.  Answered all the patient's questions today.  If EGD is negative would recommend follow-up with Dr. Chales Abrahams himself for further recommendations if he has any or a referral to DO Terrilee Files in sports medicine to consider musculoskeletal etiology/alternative diagnosis.  Did discuss this today. 3.  Patient to follow in clinic per recommendations after time of procedure.   Hyacinth Meeker, PA-C [facility]    Attending physician's note   I have taken history, reviewed the chart and examined the patient. I performed a substantive portion of this encounter, including complete performance of at least one of the key components, in conjunction with the APP. I agree with the Advanced Practitioner's note, impression and recommendations.   For EGD today   Edman Circle, MD [facility] [phone]

## 2023-05-19 NOTE — Progress Notes (Signed)
Report given to PACU, vss 

## 2023-05-19 NOTE — Op Note (Signed)
[facility] Patient Name: Nathaniel Davidson Procedure Date: 05/19/2023 9:38 AM MRN: [id number] Endoscopist: Lynann Bologna , MD, [id number] Age: 52 Referring MD:  Date of Birth: 03/22/1971 Gender: Male Account #: [id number] Procedure:                Upper GI endoscopy Indications:              Abdominal pain in the right upper quadrant Medicines:                Monitored Anesthesia Care Procedure:                Pre-Anesthesia Assessment:                           - Prior to the procedure, a History and Physical                            was performed, and patient medications and                            allergies were reviewed. The patient's tolerance of                            previous anesthesia was also reviewed. The risks                            and benefits of the procedure and the sedation                            options and risks were discussed with the patient.                            All questions were answered, and informed consent                            was obtained. Prior Anticoagulants: The patient has                            taken no anticoagulant or antiplatelet agents. ASA                            Grade Assessment: II - A patient with mild systemic                            disease. After reviewing the risks and benefits,                            the patient was deemed in satisfactory condition to                            undergo the procedure.                           After obtaining informed consent, the endoscope was  passed under direct vision. Throughout the                            procedure, the patient's blood pressure, pulse, and                            oxygen saturations were monitored continuously. The                            Olympus scope [id number] was introduced through the                            mouth, and advanced to the second part of duodenum.                            The  upper GI endoscopy was accomplished without                            difficulty. The patient tolerated the procedure                            well. Scope In: Scope Out: Findings:                 The examined esophagus was mildly tortuous but                            normal with well-defined Z-line at 40 cm, examined                            by NBI. Biopsies were obtained from the proximal                            and distal esophagus with cold forceps for                            histology of suspected eosinophilic esophagitis.                           Localized mild inflammation characterized by                            erythema was found in the gastric antrum. Biopsies                            were taken with a cold forceps for histology.                           The examined duodenum was normal except for                            incidental small diverticulum in the second portion                            of the  duodenum. Biopsies for histology were taken                            with a cold forceps for evaluation of celiac                            disease. Complications:            No immediate complications. Estimated Blood Loss:     Estimated blood loss: none. Impression:               - Mild gastritis. Recommendation:           - Patient has a contact number available for                            emergencies. The signs and symptoms of potential                            delayed complications were discussed with the                            patient. Return to normal activities tomorrow.                            Written discharge instructions were provided to the                            patient.                           - Resume previous diet.                           - Continue present medications.                           - Await pathology results.                           - The findings and recommendations were discussed                             with the patient.                           - Return to GI clinic with Hyacinth Meeker, PA if                            still with problems. Lynann Bologna, MD 05/19/2023 9:52:07 AM This report has been signed electronically.

## 2023-05-20 ENCOUNTER — Telehealth: Payer: Self-pay

## 2023-05-20 NOTE — Telephone Encounter (Signed)
  Follow up Call-     05/19/2023    9:24 AM  Call back number  Post procedure Call Back phone  # [phone]  Permission to leave phone message Yes     Patient questions:  Do you have a fever, pain , or abdominal swelling? No. Pain Score  0 *  Have you tolerated food without any problems? Yes.    Have you been able to return to your normal activities? Yes.    Do you have any questions about your discharge instructions: Diet   No. Medications  No. Follow up visit  No.  Do you have questions or concerns about your Care? No.  Actions: * If pain score is 4 or above: No action needed, pain <4.

## 2023-05-22 ENCOUNTER — Encounter: Payer: Self-pay | Admitting: Gastroenterology

## 2023-07-31 ENCOUNTER — Ambulatory Visit: Payer: 59 | Admitting: Physician Assistant

## 2023-12-01 ENCOUNTER — Telehealth: Payer: Self-pay | Admitting: Emergency Medicine

## 2023-12-01 NOTE — Telephone Encounter (Signed)
Copied from CRM [id number]. Topic: General - Other >> Dec 01, 2023 10:18 AM Samuel Jester B wrote: Reason for CRM: Pt would like a callback to schedule a prostate follow up.  ---  Dr. Alvy Bimler recommended a urology referral for this pt, please start referral asap.

## 2023-12-02 ENCOUNTER — Other Ambulatory Visit: Payer: Self-pay | Admitting: Radiology

## 2023-12-02 DIAGNOSIS — Z125 Encounter for screening for malignant neoplasm of prostate: Secondary | ICD-10-CM

## 2023-12-02 NOTE — Telephone Encounter (Signed)
Place referral please.  Thanks.

## 2023-12-02 NOTE — Telephone Encounter (Signed)
 Referral placed.

## 2023-12-17 ENCOUNTER — Ambulatory Visit: Payer: 59 | Admitting: Emergency Medicine

## 2023-12-30 ENCOUNTER — Encounter: Payer: Self-pay | Admitting: Emergency Medicine

## 2023-12-30 ENCOUNTER — Ambulatory Visit: Payer: 59 | Admitting: Emergency Medicine

## 2023-12-30 ENCOUNTER — Other Ambulatory Visit: Payer: Self-pay | Admitting: Emergency Medicine

## 2023-12-30 VITALS — BP 120/82 | HR 84 | Temp 98.5°F | Ht 66.0 in | Wt 196.0 lb

## 2023-12-30 DIAGNOSIS — E785 Hyperlipidemia, unspecified: Secondary | ICD-10-CM

## 2023-12-30 DIAGNOSIS — Z23 Encounter for immunization: Secondary | ICD-10-CM | POA: Diagnosis not present

## 2023-12-30 DIAGNOSIS — R399 Unspecified symptoms and signs involving the genitourinary system: Secondary | ICD-10-CM | POA: Diagnosis not present

## 2023-12-30 DIAGNOSIS — Z8042 Family history of malignant neoplasm of prostate: Secondary | ICD-10-CM

## 2023-12-30 DIAGNOSIS — B351 Tinea unguium: Secondary | ICD-10-CM | POA: Insufficient documentation

## 2023-12-30 LAB — CBC WITH DIFFERENTIAL/PLATELET
Basophils Absolute: 0 10*3/uL (ref 0.0–0.1)
Basophils Relative: 0.7 % (ref 0.0–3.0)
Eosinophils Absolute: 0.2 10*3/uL (ref 0.0–0.7)
Eosinophils Relative: 3.2 % (ref 0.0–5.0)
HCT: 47.6 % (ref 39.0–52.0)
Hemoglobin: 16 g/dL (ref 13.0–17.0)
Lymphocytes Relative: 30.5 % (ref 12.0–46.0)
Lymphs Abs: 1.5 10*3/uL (ref 0.7–4.0)
MCHC: 33.6 g/dL (ref 30.0–36.0)
MCV: 85.5 fl (ref 78.0–100.0)
Monocytes Absolute: 0.3 10*3/uL (ref 0.1–1.0)
Monocytes Relative: 5.7 % (ref 3.0–12.0)
Neutro Abs: 3 10*3/uL (ref 1.4–7.7)
Neutrophils Relative %: 59.9 % (ref 43.0–77.0)
Platelets: 220 10*3/uL (ref 150.0–400.0)
RBC: 5.57 Mil/uL (ref 4.22–5.81)
RDW: 13.2 % (ref 11.5–15.5)
WBC: 4.9 10*3/uL (ref 4.0–10.5)

## 2023-12-30 LAB — URINALYSIS
Bilirubin Urine: NEGATIVE
Hgb urine dipstick: NEGATIVE
Ketones, ur: NEGATIVE
Leukocytes,Ua: NEGATIVE
Nitrite: NEGATIVE
Specific Gravity, Urine: 1.02 (ref 1.000–1.030)
Total Protein, Urine: NEGATIVE
Urine Glucose: NEGATIVE
Urobilinogen, UA: 0.2 (ref 0.0–1.0)
pH: 6 (ref 5.0–8.0)

## 2023-12-30 LAB — LIPID PANEL
Cholesterol: 215 mg/dL — ABNORMAL HIGH (ref 0–200)
HDL: 46.2 mg/dL (ref >39.00)
LDL Cholesterol: 113 mg/dL — ABNORMAL HIGH (ref 0–99)
NonHDL: 168.3
Total CHOL/HDL Ratio: 5
Triglycerides: 275 mg/dL — ABNORMAL HIGH (ref 0.0–149.0)
VLDL: 55 mg/dL — ABNORMAL HIGH (ref 0.0–40.0)

## 2023-12-30 LAB — COMPREHENSIVE METABOLIC PANEL
ALT: 19 U/L (ref 0–53)
AST: 17 U/L (ref 0–37)
Albumin: 4.1 g/dL (ref 3.5–5.2)
Alkaline Phosphatase: 82 U/L (ref 39–117)
BUN: 19 mg/dL (ref 6–23)
CO2: 29 meq/L (ref 19–32)
Calcium: 9.5 mg/dL (ref 8.4–10.5)
Chloride: 104 meq/L (ref 96–112)
Creatinine, Ser: 0.96 mg/dL (ref 0.40–1.50)
GFR: 89.33 mL/min (ref >60.00)
Glucose, Bld: 83 mg/dL (ref 70–99)
Potassium: 4.5 meq/L (ref 3.5–5.1)
Sodium: 139 meq/L (ref 135–145)
Total Bilirubin: 0.3 mg/dL (ref 0.2–1.2)
Total Protein: 6.5 g/dL (ref 6.0–8.3)

## 2023-12-30 LAB — HEMOGLOBIN A1C: Hgb A1c MFr Bld: 5.5 % (ref 4.6–6.5)

## 2023-12-30 LAB — PSA: PSA: 3.19 ng/mL (ref 0.10–4.00)

## 2023-12-30 MED ORDER — ROSUVASTATIN CALCIUM 10 MG PO TABS: 10.0000 mg | ORAL_TABLET | Freq: Every day | ORAL | 3 refills | Status: AC

## 2023-12-30 MED ORDER — TAMSULOSIN HCL 0.4 MG PO CAPS
0.4000 mg | ORAL_CAPSULE | Freq: Every day | ORAL | 3 refills | Status: AC
Start: 2023-12-30 — End: ?

## 2023-12-30 NOTE — Patient Instructions (Signed)
 Mantenimiento de Research officer, political party, Male Adoptar un estilo de vida saludable y recibir atencin preventiva son importantes para promover la salud y Counsellor. Consulte al mdico sobre: El esquema adecuado para hacerse pruebas y exmenes peridicos. Cosas que puede hacer por su cuenta para prevenir enfermedades y [place] sano. Qu debo saber sobre la dieta, el peso y el ejercicio? Consuma una dieta saludable  Consuma una dieta que incluya muchas verduras, frutas, productos lcteos con bajo contenido de [place] y Associate Professor. No consuma muchos alimentos ricos en grasas slidas, azcares agregados o sodio. Mantenga un peso saludable El ndice de masa muscular [facility]) es una medida que puede utilizarse para identificar posibles problemas de [place]. Proporciona una estimacin de la grasa corporal basndose en el peso y la altura. Su mdico puede ayudarle a Engineer, site [facility] y a Personnel officer o Pharmacologist un peso saludable. Haga ejercicio con regularidad Haga ejercicio con regularidad. Esta es una de las prcticas ms importantes que puede hacer por su salud. La [employer] de los adultos deben seguir estas pautas: Education officer, environmental, al menos, 150 minutos de actividad fsica por semana. El ejercicio debe aumentar la frecuencia cardaca y Media planner transpirar (ejercicio de intensidad moderada). Hacer ejercicios de fortalecimiento por lo [employer] por semana. Agregue esto a su plan de ejercicio de intensidad moderada. Pase menos tiempo sentado. Incluso la actividad fsica ligera puede ser beneficiosa. Controle sus niveles de colesterol y lpidos en la sangre Comience a realizarse anlisis de lpidos y Oncologist en la sangre a los 20 aos y luego reptalos cada 5 aos. Es posible que Insurance underwriter los niveles de colesterol con mayor frecuencia si: Sus niveles de lpidos y colesterol son altos. Es mayor de 40 aos. Presenta un alto riesgo de padecer enfermedades cardacas. Qu debo  saber sobre las pruebas de deteccin del cncer? Muchos tipos de cncer pueden detectarse de manera temprana y, a menudo, pueden prevenirse. Segn su historia clnica y sus antecedentes familiares, es posible que deba realizarse pruebas de deteccin del cncer en diferentes edades. Esto puede incluir pruebas de deteccin de lo siguiente: Building services engineer. Cncer de prstata. Cncer de piel. Cncer de pulmn. Qu debo saber sobre la enfermedad cardaca, la diabetes y la hipertensin arterial? Presin arterial y enfermedad cardaca La hipertensin arterial causa enfermedades cardacas y [place] el riesgo de accidente cerebrovascular. Es ms probable que esto se manifieste en las personas que tienen lecturas de presin arterial alta o tienen sobrepeso. Hable con el mdico sobre sus valores de presin arterial deseados. Hgase controlar la presin arterial: Cada 3 a 5 aos si tiene entre 18 y 71 aos. Todos los aos si es mayor de 40 aos. Si tiene entre 65 y 26 aos y es fumador o Insurance underwriter, pregntele al mdico si debe realizarse una prueba de deteccin de aneurisma artico abdominal (AAA) por nica vez. Diabetes Realcese exmenes de deteccin de la diabetes con regularidad. Este anlisis revisa el nivel de azcar en la sangre en [place]. Hgase las pruebas de deteccin: Cada tres aos despus de los 45 aos de edad si tiene un peso normal y un bajo riesgo de padecer diabetes. Con ms frecuencia y a partir de [place] edad inferior si tiene sobrepeso o un alto riesgo de padecer diabetes. Qu debo saber sobre la prevencin de infecciones? Hepatitis B Si tiene un riesgo ms alto de contraer hepatitis B, debe someterse a un examen de deteccin de este virus. Hable con el mdico para averiguar si tiene riesgo de  contraer la infeccin por hepatitis B. Hepatitis C Se recomienda un anlisis de Benjamin Perez para: Todos los que nacieron entre 1945 y [phone]. Todas las personas que tengan un riesgo de haber  contrado hepatitis C. Enfermedades de transmisin sexual (ETS) Debe realizarse pruebas de deteccin de ITS todos los aos, incluidas la gonorrea y la clamidia, si: Es sexualmente activo y es menor de [address]. Es mayor de [address], y Public affairs consultant informa que corre riesgo de tener este tipo de infecciones. La actividad sexual ha cambiado desde que le hicieron la ltima prueba de deteccin y tiene un riesgo mayor de Warehouse manager clamidia o Copy. Pregntele al mdico si usted tiene riesgo. Pregntele al mdico si usted tiene un alto riesgo de Primary school teacher VIH. El mdico tambin puede recomendarle un medicamento recetado para ayudar a evitar la infeccin por el VIH. Si elige tomar medicamentos para prevenir el VIH, primero debe [employer] de deteccin del VIH. Luego debe hacerse anlisis cada 3 meses mientras est tomando los medicamentos. Siga estas indicaciones en su casa: Consumo de alcohol No beba alcohol si el mdico se lo prohbe. Si bebe alcohol: Limite la cantidad que consume de 0 a 2 bebidas por da. Sepa cunta cantidad de alcohol hay en las bebidas que toma. En los [address], una medida equivale a una botella de cerveza de 12 oz (355 ml), un vaso de vino de 5 oz (148 ml) o un vaso de una bebida alcohlica de alta graduacin de 1 oz (44 ml). Estilo de vida No consuma ningn producto que contenga nicotina o tabaco. Estos productos incluyen cigarrillos, tabaco para Theatre manager y aparatos de vapeo, como los Administrator, Civil Service. Si necesita ayuda para dejar de consumir estos productos, consulte al mdico. No consuma drogas. No comparta agujas. Solicite ayuda a su mdico si necesita apoyo o informacin para abandonar las drogas. Indicaciones generales Realcese los estudios de rutina de [address], dentales y de Wellsite geologist. Mantngase al da con las vacunas. Infrmele a su mdico si: Se siente deprimido con frecuencia. Alguna vez ha sido vctima de [place] o no se siente seguro en su  casa. Resumen Adoptar un estilo de vida saludable y recibir atencin preventiva son importantes para promover la salud y Counsellor. Siga las instrucciones del mdico acerca de una dieta saludable, el ejercicio y la realizacin de pruebas o exmenes para Hotel manager. Siga las instrucciones del mdico con respecto al control del colesterol y la presin arterial. Esta informacin no tiene Theme park manager el consejo del mdico. Asegrese de hacerle al mdico cualquier pregunta que tenga. Document Revised: 03/21/2021 Document Reviewed: 03/21/2021 Elsevier Patient Education  2024 [employer].

## 2023-12-30 NOTE — Progress Notes (Signed)
 Nathaniel Davidson 53 y.o.   Chief Complaint  Patient presents with   Follow-up    Patient here for f/u and lab work     HISTORY OF PRESENT ILLNESS: This is a 53 y.o. male here for follow-up of general medical health History of dyslipidemia. Concerned about his prostate.  Complaining of diminished urinary flow and urinary frequency for several months Has family history of prostate cancer Also complaining of chronic fungal infection of left thumbnail.  Requesting dermatology referral  HPI   Prior to Admission medications   Medication Sig Start Date End Date Taking? Authorizing Provider  pantoprazole (PROTONIX) 40 MG tablet TAKE 1 TABLET BY MOUTH DAILY BEFORE BREAFFAST NEED APPT FOR FURTHER REFILLS Patient not taking: Reported on 12/30/2023 09/07/18   Meryl Dare, MD  rosuvastatin (CRESTOR) 10 MG tablet Take 1 tablet (10 mg total) by mouth daily. Patient not taking: Reported on 12/30/2023 08/07/20   Georgina Quint, MD    No Known Allergies  Patient Active Problem List   Diagnosis Date Noted   Abdominal pain, chronic, right upper quadrant 10/14/2022   Dyslipidemia 06/08/2018    Past Medical History:  Diagnosis Date   Dyslipidemia    Gastritis    GERD (gastroesophageal reflux disease)     No past surgical history on file.  Social History   Socioeconomic History   Marital status: Married    Spouse name: Not on file   Number of children: Not on file   Years of education: Not on file   Highest education level: Not on file  Occupational History   Not on file  Tobacco Use   Smoking status: Never   Smokeless tobacco: Never  Vaping Use   Vaping status: Never Used  Substance and Sexual Activity   Alcohol use: Yes    Alcohol/week: 1.0 - 3.0 standard drink of alcohol    Types: 1 - 3 Cans of beer per week   Drug use: No   Sexual activity: Yes  Other Topics Concern   Not on file  Social History Narrative   Not on file   Social Drivers of Health    Financial Resource Strain: Not on file  Food Insecurity: Not on file  Transportation Needs: Not on file  Physical Activity: Not on file  Stress: Not on file  Social Connections: Not on file  Intimate Partner Violence: Not on file    Family History  Problem Relation Age of Onset   Prostate cancer Father 68   Esophageal cancer Neg Hx    Colon cancer Neg Hx      Review of Systems  Constitutional: Negative.  Negative for chills and fever.  HENT: Negative.  Negative for congestion and sore throat.   Respiratory: Negative.  Negative for cough and shortness of breath.   Cardiovascular: Negative.  Negative for chest pain and palpitations.  Gastrointestinal:  Negative for abdominal pain, diarrhea, nausea and vomiting.  Genitourinary:  Positive for frequency and urgency.  Skin: Negative.  Negative for rash.  Neurological: Negative.  Negative for dizziness and headaches.  All other systems reviewed and are negative.   Vitals:   12/30/23 0937  BP: 120/82  Pulse: 84  Temp: 98.5 F (36.9 C)  SpO2: 96%    Physical Exam Vitals reviewed.  Constitutional:      Appearance: Normal appearance.  HENT:     Head: Normocephalic.     Right Ear: Tympanic membrane, ear canal and external ear normal.     Left  Ear: Tympanic membrane, ear canal and external ear normal.     Mouth/Throat:     Mouth: Mucous membranes are moist.     Pharynx: Oropharynx is clear.  Eyes:     Extraocular Movements: Extraocular movements intact.     Conjunctiva/sclera: Conjunctivae normal.     Pupils: Pupils are equal, round, and reactive to light.  Cardiovascular:     Rate and Rhythm: Normal rate and regular rhythm.     Pulses: Normal pulses.     Heart sounds: Normal heart sounds.  Pulmonary:     Effort: Pulmonary effort is normal.     Breath sounds: Normal breath sounds.  Abdominal:     Palpations: Abdomen is soft.     Tenderness: There is no abdominal tenderness.  Musculoskeletal:     Cervical back:  No tenderness.  Lymphadenopathy:     Cervical: No cervical adenopathy.  Skin:    General: Skin is warm and dry.     Capillary Refill: Capillary refill takes less than 2 seconds.  Neurological:     General: No focal deficit present.     Mental Status: He is alert and oriented to person, place, and time.  Psychiatric:        Mood and Affect: Mood normal.        Behavior: Behavior normal.      ASSESSMENT & PLAN: A total of 43 minutes was spent with the patient and counseling/coordination of care regarding preparing for this visit, review of most recent office visit notes, review of multiple chronic medical conditions and their management, review of all medications, review of most recent bloodwork results, review of health maintenance items, education on nutrition, prognosis, documentation, and need for follow up.   Problem List Items Addressed This Visit       Musculoskeletal and Integument   Onychomycosis of nail of digit of hand   Chronic ongoing problem Needs dermatology evaluation.  Referral placed today Over-the-counter medications not helping      Relevant Orders   Ambulatory referral to Dermatology   CBC with Differential/Platelet     Other   Dyslipidemia - Primary   Chronic stable condition Not taking rosuvastatin Lipid profile done today Cardiovascular risks associated with dyslipidemia discussed Diet and nutrition discussed The 10-year ASCVD risk score (Arnett DK, et al., 2019) is: 4.5%   Values used to calculate the score:     Age: 45 years     Sex: Male     Is Non-Hispanic African American: No     Diabetic: No     Tobacco smoker: No     Systolic Blood Pressure: 120 mmHg     Is BP treated: No     HDL Cholesterol: 52.1 mg/dL     Total Cholesterol: 203 mg/dL       Relevant Orders   CBC with Differential/Platelet   Comprehensive metabolic panel   Hemoglobin A1c   Lipid panel   Lower urinary tract symptoms   Active and affecting quality of  life Recommend to start Flomax 0.4 mg at bedtime Needs urology evaluation Referral placed today PSA done today      Relevant Medications   tamsulosin (FLOMAX) 0.4 MG CAPS capsule   Other Relevant Orders   Ambulatory referral to Urology   PSA   Urinalysis   Family history of prostate cancer   Along with lower urinary tract symptoms, recommend urology evaluation Referral placed today      Relevant Orders   Ambulatory referral to Urology  PSA   Other Visit Diagnoses       Need for vaccination       Relevant Orders   Flu vaccine trivalent PF, 6mos and older(Flulaval,Afluria,Fluarix,Fluzone)      Patient Instructions  Mantenimiento de la salud en los hombres Health Maintenance, Male Adoptar un estilo de vida saludable y recibir atencin preventiva son importantes para promover la salud y Counsellor. Consulte al mdico sobre: El esquema adecuado para hacerse pruebas y exmenes peridicos. Cosas que puede hacer por su cuenta para prevenir enfermedades y Pandora sano. Qu debo saber sobre la dieta, el peso y el ejercicio? Consuma una dieta saludable  Consuma una dieta que incluya muchas verduras, frutas, productos lcteos con bajo contenido de [place] y Associate Professor. No consuma muchos alimentos ricos en grasas slidas, azcares agregados o sodio. Mantenga un peso saludable El ndice de masa muscular [facility]) es una medida que puede utilizarse para identificar posibles problemas de [place]. Proporciona una estimacin de la grasa corporal basndose en el peso y la altura. Su mdico puede ayudarle a Engineer, site [facility] y a Personnel officer o Pharmacologist un peso saludable. Haga ejercicio con regularidad Haga ejercicio con regularidad. Esta es una de las prcticas ms importantes que puede hacer por su salud. La [employer] de los adultos deben seguir estas pautas: Education officer, environmental, al menos, 150 minutos de actividad fsica por semana. El ejercicio debe aumentar la frecuencia cardaca y Media planner transpirar  (ejercicio de intensidad moderada). Hacer ejercicios de fortalecimiento por lo [employer] por semana. Agregue esto a su plan de ejercicio de intensidad moderada. Pase menos tiempo sentado. Incluso la actividad fsica ligera puede ser beneficiosa. Controle sus niveles de colesterol y lpidos en la sangre Comience a realizarse anlisis de lpidos y Oncologist en la sangre a los 20 aos y luego reptalos cada 5 aos. Es posible que Insurance underwriter los niveles de colesterol con mayor frecuencia si: Sus niveles de lpidos y colesterol son altos. Es mayor de 40 aos. Presenta un alto riesgo de padecer enfermedades cardacas. Qu debo saber sobre las pruebas de deteccin del cncer? Muchos tipos de cncer pueden detectarse de manera temprana y, a menudo, pueden prevenirse. Segn su historia clnica y sus antecedentes familiares, es posible que deba realizarse pruebas de deteccin del cncer en diferentes edades. Esto puede incluir pruebas de deteccin de lo siguiente: Building services engineer. Cncer de prstata. Cncer de piel. Cncer de pulmn. Qu debo saber sobre la enfermedad cardaca, la diabetes y la hipertensin arterial? Presin arterial y enfermedad cardaca La hipertensin arterial causa enfermedades cardacas y [place] el riesgo de accidente cerebrovascular. Es ms probable que esto se manifieste en las personas que tienen lecturas de presin arterial alta o tienen sobrepeso. Hable con el mdico sobre sus valores de presin arterial deseados. Hgase controlar la presin arterial: Cada 3 a 5 aos si tiene entre 18 y 56 aos. Todos los aos si es mayor de 40 aos. Si tiene entre 65 y 41 aos y es fumador o Insurance underwriter, pregntele al mdico si debe realizarse una prueba de deteccin de aneurisma artico abdominal (AAA) por nica vez. Diabetes Realcese exmenes de deteccin de la diabetes con regularidad. Este anlisis revisa el nivel de azcar en la sangre en [place]. Hgase las pruebas  de deteccin: Cada tres aos despus de los 45 aos de edad si tiene un peso normal y un bajo riesgo de padecer diabetes. Con ms frecuencia y a partir de [place] edad inferior si tiene sobrepeso o un alto riesgo de Marine scientist  diabetes. Qu debo saber sobre la prevencin de infecciones? Hepatitis B Si tiene un riesgo ms alto de contraer hepatitis B, debe someterse a un examen de deteccin de este virus. Hable con el mdico para averiguar si tiene riesgo de contraer la infeccin por hepatitis B. Hepatitis C Se recomienda un anlisis de [place] para: Todos los que nacieron entre 1945 y [phone]. Todas las personas que tengan un riesgo de haber contrado hepatitis C. Enfermedades de transmisin sexual (ETS) Debe realizarse pruebas de deteccin de ITS todos los aos, incluidas la gonorrea y la clamidia, si: Es sexualmente activo y es menor de [address]. Es mayor de [address], y Public affairs consultant informa que corre riesgo de tener este tipo de infecciones. La actividad sexual ha cambiado desde que le hicieron la ltima prueba de deteccin y tiene un riesgo mayor de Warehouse manager clamidia o Copy. Pregntele al mdico si usted tiene riesgo. Pregntele al mdico si usted tiene un alto riesgo de Primary school teacher VIH. El mdico tambin puede recomendarle un medicamento recetado para ayudar a evitar la infeccin por el VIH. Si elige tomar medicamentos para prevenir el VIH, primero debe [employer] de deteccin del VIH. Luego debe hacerse anlisis cada 3 meses mientras est tomando los medicamentos. Siga estas indicaciones en su casa: Consumo de alcohol No beba alcohol si el mdico se lo prohbe. Si bebe alcohol: Limite la cantidad que consume de 0 a 2 bebidas por da. Sepa cunta cantidad de alcohol hay en las bebidas que toma. En los [address], una medida equivale a una botella de cerveza de 12 oz (355 ml), un vaso de vino de 5 oz (148 ml) o un vaso de una bebida alcohlica de alta graduacin de 1 oz (44 ml). Estilo de  vida No consuma ningn producto que contenga nicotina o tabaco. Estos productos incluyen cigarrillos, tabaco para Theatre manager y aparatos de vapeo, como los Administrator, Civil Service. Si necesita ayuda para dejar de consumir estos productos, consulte al mdico. No consuma drogas. No comparta agujas. Solicite ayuda a su mdico si necesita apoyo o informacin para abandonar las drogas. Indicaciones generales Realcese los estudios de rutina de [address], dentales y de Wellsite geologist. Mantngase al da con las vacunas. Infrmele a su mdico si: Se siente deprimido con frecuencia. Alguna vez ha sido vctima de [place] o no se siente seguro en su casa. Resumen Adoptar un estilo de vida saludable y recibir atencin preventiva son importantes para promover la salud y Counsellor. Siga las instrucciones del mdico acerca de una dieta saludable, el ejercicio y la realizacin de pruebas o exmenes para Hotel manager. Siga las instrucciones del mdico con respecto al control del colesterol y la presin arterial. Esta informacin no tiene Theme park manager el consejo del mdico. Asegrese de hacerle al mdico cualquier pregunta que tenga. Document Revised: 03/21/2021 Document Reviewed: 03/21/2021 Elsevier Patient Education  2024 Elsevier Inc.    Edwina Barth, MD [facility]

## 2023-12-30 NOTE — Assessment & Plan Note (Signed)
 Along with lower urinary tract symptoms, recommend urology evaluation Referral placed today

## 2023-12-30 NOTE — Assessment & Plan Note (Signed)
 Chronic stable condition Not taking rosuvastatin Lipid profile done today Cardiovascular risks associated with dyslipidemia discussed Diet and nutrition discussed The 10-year ASCVD risk score (Arnett DK, et al., 2019) is: 4.5%   Values used to calculate the score:     Age: 53 years     Sex: Male     Is Non-Hispanic African American: No     Diabetic: No     Tobacco smoker: No     Systolic Blood Pressure: 120 mmHg     Is BP treated: No     HDL Cholesterol: 52.1 mg/dL     Total Cholesterol: 203 mg/dL

## 2023-12-30 NOTE — Assessment & Plan Note (Signed)
 Chronic ongoing problem Needs dermatology evaluation.  Referral placed today Over-the-counter medications not helping

## 2023-12-30 NOTE — Assessment & Plan Note (Signed)
 Active and affecting quality of life Recommend to start Flomax 0.4 mg at bedtime Needs urology evaluation Referral placed today PSA done today

## 2024-09-13 ENCOUNTER — Ambulatory Visit: Admitting: Physician Assistant

## 2024-10-29 ENCOUNTER — Telehealth: Payer: Self-pay

## 2024-10-29 NOTE — Telephone Encounter (Signed)
 MyChart message sent regarding referral. Placed by Dr. Sagardia

## 2024-11-25 ENCOUNTER — Ambulatory Visit: Payer: Self-pay | Admitting: *Deleted

## 2024-11-25 NOTE — Telephone Encounter (Signed)
 FYI Only or Action Required?: FYI only for provider: appointment scheduled on 11/30/24.  Patient was last seen in primary care on 12/30/2023 by Purcell Emil Schanz, MD.  Called Nurse Triage reporting Leg Pain.  Symptoms began several weeks ago.  Interventions attempted: Rest, hydration, or home remedies.  Symptoms are: gradually worsening.  Triage Disposition: See PCP When Office is Open (Within 3 Days)  Patient/caregiver understands and will follow disposition?: Yes    Patient requesting for someone to call back to assist with scheduling his wife new patient appt with Dr. Purcell. Wife has new insurance and would like to see if any issues. Wife is Maryjean Polite DOB 01/19/1965.           Reason for Disposition  [1] MODERATE pain (e.g., interferes with normal activities, limping) AND [2] present > 3 days  Answer Assessment - Initial Assessment Questions Doloris Coria ID # [id number] assisted with call. Scheduled patient appt with PCP 11/30/24. Patient wants to see PCP. Recommended if sx worsen call back or go to UC or ED.        1. ONSET: When did the pain start?      6 weeks ago  2. LOCATION: Where is the pain located?       Right leg  3. PAIN: How bad is the pain?    (Scale 1-10; or mild, moderate, severe)     Mild to moderate and becomes moderate at night  4. WORK OR EXERCISE: Has there been any recent work or exercise that involved this part of the body?      No  5. CAUSE: What do you think is causing the leg pain?     Not sure  6. OTHER SYMPTOMS: Do you have any other symptoms? (e.g., chest pain, back pain, breathing difficulty, swelling, rash, fever, numbness, weakness)     Pain right inner thigh area of leg. Mild now and does not interfere with normal activities.  Pain becoming worse at night. Has noted he is not urinating as much at night reports as low urination  at night.  No redness no swelling no chest pain no difficulty breathing no  fever.  Protocols used: Leg Pain-A-AH

## 2024-11-25 NOTE — Telephone Encounter (Signed)
" ° °  Reason for Triage: Pain in right leg. Started 6 weeks ago  "

## 2024-11-25 NOTE — Telephone Encounter (Signed)
 See alternate Triage encounter- this is a duplicate

## 2024-11-30 ENCOUNTER — Ambulatory Visit (INDEPENDENT_AMBULATORY_CARE_PROVIDER_SITE_OTHER)

## 2024-11-30 ENCOUNTER — Ambulatory Visit: Payer: Self-pay | Admitting: Emergency Medicine

## 2024-11-30 ENCOUNTER — Encounter: Payer: Self-pay | Admitting: Emergency Medicine

## 2024-11-30 ENCOUNTER — Ambulatory Visit: Admitting: Emergency Medicine

## 2024-11-30 VITALS — BP 124/80 | HR 80 | Temp 98.4°F | Ht 66.0 in | Wt 198.0 lb

## 2024-11-30 DIAGNOSIS — M79651 Pain in right thigh: Secondary | ICD-10-CM | POA: Diagnosis not present

## 2024-11-30 NOTE — Patient Instructions (Signed)
 Calambres en las piernas: qu significan Leg Cramps: What They Mean Los calambres en las piernas se originan cuando uno o varios msculos se tensan y se pierde el control sobre ellos. Pueden ocurrir durante el ejercicio o el reposo. Los trw automotive piernas son dolorosos y pueden durar de unos segundos a varios minutos. Tambin pueden repetirse muchas veces antes de detenerse. Por lo general, la causa de los trw automotive piernas no es un problema mdico grave. A menudo, se desconoce la causa. Algunas causas frecuentes incluyen: Problemas para mover el cuerpo o no moverlo, por ejemplo: [employer], como durante el ejercicio intenso. Repetir el mismo movimiento una y [place]. No calentar ni estirar antes de practicar deporte, o de realizar c.h. robinson worldwide. Utilizar una tcnica o forma incorrectas al practicar deportes o realizar distintas actividades. Permanecer en una misma posicin durante mucho tiempo. Problemas de equilibrio de agua o customer service manager, como: No beber suficiente lquido o estar deshidratado. Marearse a causa del market researcher. Tener niveles bajos de minerales llamados electrolitos en la sangre, como el potasio y el calcio. Puede deberse a lo siguiente: Estar embarazada. Tomar medicamentos para orinar ms, tambin llamados diurticos. No seguir una dieta demasiado nutritiva. Efectos secundarios de algunos medicamentos. Siga las siguientes instrucciones en el hogar: Comida y bebida Coma y beba segn lo indicado. Siga una dieta saludable que incluya una gran cantidad de nutrientes para que los msculos funcionen bien. Una dieta saludable incluye frutas y verduras, protenas magras, cereales integrales y productos lcteos descremados o semidescremados. Beba suficiente lquido como para que la orina sea de color amarillo plido. Beber ms agua ayuda a honeywell. Human resources officer y los calambres musculares     [place], elongue y  relaje el msculo acalambrado. Repita durante varios minutos. Use hielo o una bolsa de hielo segn lo indicado. Coloque una toalla entre la piel y el hielo. Aplique hielo durante 20 minutos, entre 2 y 3 veces por futures trader. Aplique calor segn lo indicado. Use la fuente de calor que el office depot recomiende, como una compresa caliente y hmeda, o una almohadilla trmica. Hgalo con frecuencia segn lo indicado. Coloque una toalla entre la piel y la fuente de calor. Aplique calor entre 20 y 30 minutos. Si la piel se le torna de color rojo, quite el hielo o el calor de inmediato para evitar lesiones en la piel. El riesgo de lesin es mayor si no siente dolor, calor ni fro. Tome duchas o baos con agua caliente para relajar los msculos tensos. Instrucciones generales Si tiene muchos calambres en las piernas, evite los entrenamientos intensos durante [address] Tome los suplementos y united parcel solo como se lo indicaron. Comunquese con un mdico si: Los calambres en las piernas empeoran o son ms frecuentes. Los trw automotive piernas no mejoran con el [place]. Tiene el pie fro, adormecido o de edison international. Esta informacin no tiene theme park manager el consejo del mdico. Asegrese de hacerle al mdico cualquier pregunta que tenga. Document Revised: 11/05/2023 Document Reviewed: 11/05/2023 Elsevier Patient Education  2025 [employer].

## 2024-11-30 NOTE — Assessment & Plan Note (Signed)
 Clinically stable.  No red flag signs or symptoms Differential diagnosis discussed Appears to be musculoskeletal pain most likely secondary to activities of daily work. Pain management discussed. X-ray done today shows normal femur.  No suspicious findings. Recommend leg ultrasound to rule out DVT but unlikely Advised to contact the office if no better or worse during the next several days. Will follow-up after ultrasound is done

## 2025-04-25 ENCOUNTER — Ambulatory Visit: Admitting: Physician Assistant
